# Patient Record
Sex: Female | Born: 1950 | Race: White | Hispanic: No | Smoking: Current every day smoker
Health system: Southern US, Community
[De-identification: ages and names within clinical notes are randomized; demographics above are authoritative.]

## PROBLEM LIST (undated history)

## (undated) DIAGNOSIS — Z9889 Other specified postprocedural states: Secondary | ICD-10-CM

## (undated) DIAGNOSIS — I10 Essential (primary) hypertension: Secondary | ICD-10-CM

## (undated) DIAGNOSIS — F419 Anxiety disorder, unspecified: Secondary | ICD-10-CM

## (undated) DIAGNOSIS — Z8719 Personal history of other diseases of the digestive system: Secondary | ICD-10-CM

## (undated) DIAGNOSIS — T8859XA Other complications of anesthesia, initial encounter: Secondary | ICD-10-CM

## (undated) DIAGNOSIS — J189 Pneumonia, unspecified organism: Secondary | ICD-10-CM

## (undated) DIAGNOSIS — B1920 Unspecified viral hepatitis C without hepatic coma: Secondary | ICD-10-CM

## (undated) DIAGNOSIS — M51369 Other intervertebral disc degeneration, lumbar region without mention of lumbar back pain or lower extremity pain: Secondary | ICD-10-CM

## (undated) DIAGNOSIS — K279 Peptic ulcer, site unspecified, unspecified as acute or chronic, without hemorrhage or perforation: Secondary | ICD-10-CM

## (undated) DIAGNOSIS — M5126 Other intervertebral disc displacement, lumbar region: Secondary | ICD-10-CM

## (undated) DIAGNOSIS — M1990 Unspecified osteoarthritis, unspecified site: Secondary | ICD-10-CM

## (undated) DIAGNOSIS — J40 Bronchitis, not specified as acute or chronic: Secondary | ICD-10-CM

## (undated) DIAGNOSIS — I499 Cardiac arrhythmia, unspecified: Secondary | ICD-10-CM

## (undated) DIAGNOSIS — Z87442 Personal history of urinary calculi: Secondary | ICD-10-CM

## (undated) DIAGNOSIS — F329 Major depressive disorder, single episode, unspecified: Secondary | ICD-10-CM

## (undated) DIAGNOSIS — I739 Peripheral vascular disease, unspecified: Secondary | ICD-10-CM

## (undated) DIAGNOSIS — I219 Acute myocardial infarction, unspecified: Secondary | ICD-10-CM

## (undated) DIAGNOSIS — R112 Nausea with vomiting, unspecified: Secondary | ICD-10-CM

## (undated) DIAGNOSIS — F32A Depression, unspecified: Secondary | ICD-10-CM

## (undated) DIAGNOSIS — G8929 Other chronic pain: Secondary | ICD-10-CM

## (undated) DIAGNOSIS — J45909 Unspecified asthma, uncomplicated: Secondary | ICD-10-CM

## (undated) DIAGNOSIS — G2581 Restless legs syndrome: Secondary | ICD-10-CM

## (undated) DIAGNOSIS — K219 Gastro-esophageal reflux disease without esophagitis: Secondary | ICD-10-CM

## (undated) DIAGNOSIS — J449 Chronic obstructive pulmonary disease, unspecified: Secondary | ICD-10-CM

## (undated) DIAGNOSIS — E538 Deficiency of other specified B group vitamins: Secondary | ICD-10-CM

## (undated) DIAGNOSIS — K5790 Diverticulosis of intestine, part unspecified, without perforation or abscess without bleeding: Secondary | ICD-10-CM

## (undated) DIAGNOSIS — Z8759 Personal history of other complications of pregnancy, childbirth and the puerperium: Secondary | ICD-10-CM

## (undated) DIAGNOSIS — T4145XA Adverse effect of unspecified anesthetic, initial encounter: Secondary | ICD-10-CM

## (undated) DIAGNOSIS — K7460 Unspecified cirrhosis of liver: Secondary | ICD-10-CM

## (undated) DIAGNOSIS — H9190 Unspecified hearing loss, unspecified ear: Secondary | ICD-10-CM

## (undated) DIAGNOSIS — M940 Chondrocostal junction syndrome [Tietze]: Secondary | ICD-10-CM

## (undated) DIAGNOSIS — M5136 Other intervertebral disc degeneration, lumbar region: Secondary | ICD-10-CM

## (undated) DIAGNOSIS — I2510 Atherosclerotic heart disease of native coronary artery without angina pectoris: Secondary | ICD-10-CM

## (undated) DIAGNOSIS — M549 Dorsalgia, unspecified: Secondary | ICD-10-CM

## (undated) HISTORY — PX: MULTIPLE TOOTH EXTRACTIONS: SHX2053

## (undated) HISTORY — PX: OTHER SURGICAL HISTORY: SHX169

## (undated) HISTORY — PX: COLONOSCOPY: SHX174

## (undated) HISTORY — PX: CORONARY ANGIOPLASTY: SHX604

## (undated) HISTORY — PX: TUBAL LIGATION: SHX77

## (undated) HISTORY — PX: APPENDECTOMY: SHX54

## (undated) HISTORY — PX: OVARIAN CYST REMOVAL: SHX89

---

## 1978-05-11 HISTORY — PX: CHOLECYSTECTOMY: SHX55

## 1980-05-11 HISTORY — PX: ECTOPIC PREGNANCY SURGERY: SHX613

## 1990-05-11 HISTORY — PX: CYSTOSCOPY W/ LITHOLAPAXY / EHL: SUR377

## 1993-05-11 HISTORY — PX: TUMOR EXCISION: SHX421

## 1993-05-11 HISTORY — PX: ABDOMINAL HYSTERECTOMY: SHX81

## 2003-02-18 ENCOUNTER — Encounter: Payer: Self-pay | Admitting: *Deleted

## 2003-02-18 ENCOUNTER — Observation Stay (HOSPITAL_COMMUNITY): Admission: AD | Admit: 2003-02-18 | Discharge: 2003-02-20 | Payer: Self-pay | Admitting: *Deleted

## 2003-02-20 ENCOUNTER — Encounter: Payer: Self-pay | Admitting: Hematology and Oncology

## 2003-02-26 ENCOUNTER — Other Ambulatory Visit (HOSPITAL_COMMUNITY): Admission: RE | Admit: 2003-02-26 | Discharge: 2003-05-27 | Payer: Self-pay | Admitting: Psychiatry

## 2003-05-12 DIAGNOSIS — I219 Acute myocardial infarction, unspecified: Secondary | ICD-10-CM

## 2003-05-12 HISTORY — DX: Acute myocardial infarction, unspecified: I21.9

## 2003-12-12 ENCOUNTER — Inpatient Hospital Stay (HOSPITAL_COMMUNITY): Admission: EM | Admit: 2003-12-12 | Discharge: 2003-12-17 | Payer: Self-pay | Admitting: Psychiatry

## 2004-05-11 HISTORY — PX: CARDIAC CATHETERIZATION: SHX172

## 2005-06-19 ENCOUNTER — Other Ambulatory Visit: Payer: Self-pay

## 2005-06-19 ENCOUNTER — Emergency Department: Payer: Self-pay | Admitting: Internal Medicine

## 2007-04-28 ENCOUNTER — Ambulatory Visit: Payer: Self-pay | Admitting: Gastroenterology

## 2007-05-18 ENCOUNTER — Ambulatory Visit (HOSPITAL_COMMUNITY): Admission: RE | Admit: 2007-05-18 | Discharge: 2007-05-18 | Payer: Self-pay | Admitting: Gastroenterology

## 2007-07-13 ENCOUNTER — Ambulatory Visit: Payer: Self-pay | Admitting: Obstetrics & Gynecology

## 2007-07-13 ENCOUNTER — Ambulatory Visit (HOSPITAL_COMMUNITY): Admission: RE | Admit: 2007-07-13 | Discharge: 2007-07-13 | Payer: Self-pay | Admitting: Obstetrics and Gynecology

## 2007-07-13 ENCOUNTER — Encounter: Payer: Self-pay | Admitting: Obstetrics & Gynecology

## 2008-09-17 ENCOUNTER — Ambulatory Visit: Payer: Self-pay | Admitting: Physician Assistant

## 2009-02-27 ENCOUNTER — Ambulatory Visit: Payer: Self-pay | Admitting: Internal Medicine

## 2009-05-24 ENCOUNTER — Ambulatory Visit: Payer: Self-pay | Admitting: Obstetrics & Gynecology

## 2009-05-25 ENCOUNTER — Encounter: Payer: Self-pay | Admitting: Obstetrics & Gynecology

## 2009-05-25 LAB — CONVERTED CEMR LAB
Clue Cells Wet Prep HPF POC: NONE SEEN
Trich, Wet Prep: NONE SEEN
Yeast Wet Prep HPF POC: NONE SEEN

## 2010-03-06 ENCOUNTER — Ambulatory Visit: Payer: Self-pay | Admitting: Internal Medicine

## 2010-07-10 ENCOUNTER — Emergency Department (HOSPITAL_COMMUNITY): Payer: Medicare Other

## 2010-07-10 ENCOUNTER — Inpatient Hospital Stay (HOSPITAL_COMMUNITY)
Admission: EM | Admit: 2010-07-10 | Discharge: 2010-07-12 | DRG: 313 | Disposition: A | Discharge status: Home / Self Care | Payer: Medicare Other | Attending: Internal Medicine | Admitting: Internal Medicine

## 2010-07-10 DIAGNOSIS — I10 Essential (primary) hypertension: Secondary | ICD-10-CM | POA: Diagnosis present

## 2010-07-10 DIAGNOSIS — I2510 Atherosclerotic heart disease of native coronary artery without angina pectoris: Secondary | ICD-10-CM | POA: Diagnosis present

## 2010-07-10 DIAGNOSIS — F411 Generalized anxiety disorder: Secondary | ICD-10-CM | POA: Diagnosis present

## 2010-07-10 DIAGNOSIS — R0789 Other chest pain: Principal | ICD-10-CM | POA: Diagnosis present

## 2010-07-10 DIAGNOSIS — K219 Gastro-esophageal reflux disease without esophagitis: Secondary | ICD-10-CM | POA: Diagnosis present

## 2010-07-10 DIAGNOSIS — R51 Headache: Secondary | ICD-10-CM | POA: Diagnosis present

## 2010-07-10 DIAGNOSIS — I252 Old myocardial infarction: Secondary | ICD-10-CM

## 2010-07-10 DIAGNOSIS — E669 Obesity, unspecified: Secondary | ICD-10-CM | POA: Diagnosis present

## 2010-07-10 LAB — COMPREHENSIVE METABOLIC PANEL
ALT: 14 U/L (ref 0–35)
AST: 15 U/L (ref 0–37)
CO2: 27 mEq/L (ref 19–32)
Calcium: 8.9 mg/dL (ref 8.4–10.5)
GFR calc Af Amer: >60 mL/min (ref >60)
GFR calc non Af Amer: 55 mL/min — ABNORMAL LOW (ref >60)
Potassium: 3.7 mEq/L (ref 3.5–5.1)
Sodium: 142 mEq/L (ref 135–145)

## 2010-07-10 LAB — CBC
HCT: 45.4 % (ref 36.0–46.0)
Hemoglobin: 15.2 g/dL — ABNORMAL HIGH (ref 12.0–15.0)
MCH: 32.1 pg (ref 26.0–34.0)
MCHC: 33.5 g/dL (ref 30.0–36.0)
MCV: 96 fL (ref 78.0–100.0)
Platelets: 312 10*3/uL (ref 150–400)
RBC: 4.73 MIL/uL (ref 3.87–5.11)
RDW: 14.1 % (ref 11.5–15.5)
WBC: 12.1 10*3/uL — ABNORMAL HIGH (ref 4.0–10.5)

## 2010-07-10 LAB — POCT CARDIAC MARKERS
CKMB, poc: <1 ng/mL — ABNORMAL LOW (ref 1.0–8.0)
Troponin i, poc: <0.05 ng/mL (ref 0.00–0.09)

## 2010-07-11 DIAGNOSIS — R072 Precordial pain: Secondary | ICD-10-CM

## 2010-07-11 LAB — RAPID URINE DRUG SCREEN, HOSP PERFORMED
Barbiturates: NOT DETECTED
Benzodiazepines: NOT DETECTED
Cocaine: NOT DETECTED

## 2010-07-11 LAB — CARDIAC PANEL(CRET KIN+CKTOT+MB+TROPI)
CK, MB: 0.9 ng/mL (ref 0.3–4.0)
Relative Index: INVALID (ref 0.0–2.5)
Total CK: 28 U/L (ref 7–177)
Total CK: 31 U/L (ref 7–177)
Troponin I: 0.01 ng/mL (ref 0.00–0.06)

## 2010-07-11 LAB — COMPREHENSIVE METABOLIC PANEL
AST: 20 U/L (ref 0–37)
Albumin: 2.9 g/dL — ABNORMAL LOW (ref 3.5–5.2)
CO2: 28 mEq/L (ref 19–32)
Calcium: 8.2 mg/dL — ABNORMAL LOW (ref 8.4–10.5)
Creatinine, Ser: 0.8 mg/dL (ref 0.4–1.2)
GFR calc Af Amer: >60 mL/min (ref >60)
GFR calc non Af Amer: >60 mL/min (ref >60)

## 2010-07-11 LAB — CK TOTAL AND CKMB (NOT AT ARMC)
CK, MB: 0.8 ng/mL (ref 0.3–4.0)
Relative Index: INVALID (ref 0.0–2.5)
Total CK: 36 U/L (ref 7–177)

## 2010-07-11 LAB — URINALYSIS, ROUTINE W REFLEX MICROSCOPIC
Urine Glucose, Fasting: NEGATIVE mg/dL
pH: 5.5 (ref 5.0–8.0)

## 2010-07-11 LAB — CBC
MCH: 32 pg (ref 26.0–34.0)
MCHC: 32.5 g/dL (ref 30.0–36.0)
Platelets: 283 10*3/uL (ref 150–400)

## 2010-07-11 LAB — LIPASE, BLOOD: Lipase: 35 U/L (ref 11–59)

## 2010-07-11 LAB — POCT CARDIAC MARKERS

## 2010-07-11 LAB — LIPID PANEL
Cholesterol: 160 mg/dL (ref 0–200)
Total CHOL/HDL Ratio: 4.6 RATIO

## 2010-07-12 LAB — CBC
Hemoglobin: 13 g/dL (ref 12.0–15.0)
MCHC: 31.8 g/dL (ref 30.0–36.0)
Platelets: 274 10*3/uL (ref 150–400)
RDW: 14.4 % (ref 11.5–15.5)

## 2010-07-12 LAB — BASIC METABOLIC PANEL
BUN: 8 mg/dL (ref 6–23)
Calcium: 8.3 mg/dL — ABNORMAL LOW (ref 8.4–10.5)
Creatinine, Ser: 0.87 mg/dL (ref 0.4–1.2)
GFR calc non Af Amer: >60 mL/min (ref >60)
Glucose, Bld: 113 mg/dL — ABNORMAL HIGH (ref 70–99)
Sodium: 140 mEq/L (ref 135–145)

## 2010-07-27 LAB — GLUCOSE, CAPILLARY: Glucose-Capillary: 108 mg/dL — ABNORMAL HIGH (ref 70–99)

## 2010-07-27 NOTE — Discharge Summary (Signed)
Kayla Stout, KINDEL NO.:  [id number]  MEDICAL RECORD NO.:  [id number]           PATIENT TYPE:  LOCATION:                                 FACILITY:  PHYSICIAN:  Rosanna Randy, MDDATE OF BIRTH:  [birth year]  DATE OF ADMISSION:07/10/10 DATE OF DISCHARGE:07/12/10                              DISCHARGE SUMMARY   PRIMARY CARE PHYSICIAN:  Dr. Bethann Punches at [facility] in [place].  DISCHARGE DIAGNOSES: 1. Chest pain, most likely noncardiac with negative cardiac enzymes,     no EKG changes significant for ischemia and a 2-D echo with a     systolic ejection fraction of 81%. 2. History of myocardial infarction 6 years ago, after which the     patient had a cardiac cath at [facility] and was told that she     was unable to be stented due to calcification on the coronary     arteries that were occluded. 3. Hypertension. 4. Obesity. 5. Anxiety. 6. Gastroesophageal reflux disease. 7. Headache secondary to sinusitis. 8. Elevated intraocular pressure. 9. History of hepatitis C. 10.The patient also has a past surgical history of cholecystectomy,     laparoscopy, and laparoscopic surgery for ectopic pregnancy.  MEDICATIONS AT DISCHARGE: 1. Loratadine 10 mg one tablet by mouth daily. 2. Nexium 40 mg one tablet by mouth twice a day. 3. Aspirin 81 mg one tablet by mouth daily. 4. Lisinopril 10 mg one tablet by mouth daily. 5. Toprol-XL 50 mg one tablet by mouth daily. 6. Travoprost ophthalmic 0.004% two drops in both eyes every other     day. 7. Alprazolam 1 mg one tablet by mouth daily at bedtime as needed for     anxiety and difficulty falling asleep.  DISPOSITION AND FOLLOWUP:  The patient has been discharged in stable improved condition without complaints of any chest pain.  In fact, the patient has been chest pain free for the last 24 hours after being admitted.  She has had cardiac markers cycled throughout this admission which were completely  negative and also had serial EKGs that demonstrated no ischemic changes.  There was no abnormal activity on her telemetry traces, and a 2-D echo with a normal systolic ejection fraction but unable to assess wall motion abnormalities secondary to the patient's body habitus.  The patient had been advised to arrange a hospital followup visit with her primary care physician and is going to also call Dr. Annitta Jersey office at [phone] in order to arrange an outpatient stress test with him due to her risk factors and history of prior myocardial infarction.  PROCEDURES PERFORMED:  The patient had a chest x-ray on July 10, 2010, which demonstrated mild peribronchial thickening noted but her lungs were otherwise clear.  The patient also had a 2-D echo done on July 11, 2010, that demonstrated left ventricle with normal size, normal wall thickness, normal systolic function with an ejection fraction in the range of 60-65%.  Unfortunately, images were inadequate for left ventricular wall motion assessment due to the patient's body habitus. Doppler parameters were consistent with abnormal left ventricular relaxation categorizing the patient with grade 1  diastolic dysfunction. No other procedures were made done during this admission.  No consultations were made during this admission.  BRIEF HISTORY OF HISTORY OF PRESENT ILLNESS VITAL SIGNS AND LABS:  For further details regarding this info, refer to H and P dictated by Dr. Midge Minium on June 12, 2010, but in detail, Kayla Stout is a 60 year old female with a history of previous myocardial infarction 6 years ago, history of hypertension, history of gastroesophageal reflux disease, history of obesity, and history of anxiety who came to the emergency department complaining of chest pain.  The patient reports that her chest pain started at around 6:00 p.m. in the afternoon, was retrosternal, pressure like, and also stabbing in nature.  The pain  was nonradiating.  Has no associated shortness of breath or any diaphoresis. She denies any dizziness or loss of consciousness while having this pain.  The patient in the ER had an EKG which failed to show any acute ischemic changes.  Cardiac enzymes have been negative.  The patient became hypotensive after she received some morphine in order to control her pain.  Presently, she had been with a normal blood pressure after fluid resuscitation and holding her blood pressure medications overnight.  At the moment of admission, the patient denies any nausea, vomiting, abdominal pain, dysuria, discharge, or diarrhea.  There were no exertional symptoms associated with her pain, and she denies any fever, chills, cough, or phlegm.  PHYSICAL EXAMINATION:  VITAL SIGNS:  Blood pressure 112/68 after fluid resuscitation, heart rate 77, temperature 97.7, respiratory rate 18, and oxygen saturation of 97% on room air. HEENT:  Anicteric, no pallor.  No discharge from ears, eyes, nose, and mouth. CHEST:  Bilateral air entry present.  No rhonchi, no crepitation, no wheezes. HEART:  S1 and S2.  No murmurs. ABDOMEN:  Soft, nontender, positive bowel sounds. EXTREMITIES:  Peripheral pulses were appreciated bilaterally.  No edema. NEUROLOGIC:  The patient was alert, awake, and oriented x3, able to move four extremities without any problems.  She was able to follow commands. Cranial nerves were II through XII intact and muscle strength was 5/5 bilaterally symmetrically.  LABORATORY DATA ON ADMISSION:  She had a CBC demonstrating white blood cells of 12.1, hemoglobin 15.2, platelets 312.  Cardiac markers negative x3 throughout this hospitalization.  She had a negative D-dimer. Urinary drug screen was only positive for opiates but it was taken after the patient received morphine in the emergency department.  Urinalysis was negative.  She had a lipase demonstrating a range of 35.  Magnesium level was 2.1, TSH  0.849.  Lipid profile taking throughout this hospitalization demonstrated a total cholesterol of 160, triglyceride 156, HDL 35, LDL 94.  HOSPITAL COURSE BY PROBLEM: 1. Chest pain, most likely noncardiac in etiology after having three     sets of cardiac enzymes negative, having normal traces on her     telemetry, and also no EKG changes.  She also had a 2-D echo with     normal systolic ejection fraction.  Nonetheless, due to the     patient's risk factors and also the prior history of myocardial     infarction after discussing this case with Dr. Sharyn Lull, Cardiology,     the decision was taken to have the patient see him as an outpatient     in order to have a stress test done.  The patient received Dr.     Annitta Jersey office information and also information was given to Dr.  Harwani in order to have this stress test set up.  The patient was     discharged with a double dose of her usual PPI medication in order     to treat this chest pain as a gastroesophageal reflux disease while     having a stress test as an outpatient to rule out any abnormality     with her heart. 2. Gastroesophageal reflux disease.  The patient is going to continue     using Nexium, but at this moment, she is going to start using 40 mg     by mouth twice a day. 3. Hypertension.  Blood pressure had been well controlled after her     fluid resuscitation, and the plan is to continue her lisinopril 10     mg by mouth daily and also planning to continue her Toprol-XL 50 mg     by mouth daily. 4. Anxiety.  Plan is to continue using p.r.n. Xanax. 5. History of elevated intraocular pressure.  The patient is going to     continue using Travatan ophthalmic solution. 6. History of obesity.  The patient had received advices of weight     loss and to reduce also her calorie intake in order to lose some     weight. 7. Headache secondary to sinusitis.  The patient had been started on     loratadine in order to control this  problem.  At discharge, the patient's vital signs, temperature 98.3, heart rate 77, respiratory rate 18, blood pressure 120/74, oxygen saturation 94% on room air.  She had a BMET demonstrating a sodium of 140, potassium 3.8, chloride 107, bicarb 27, BUN 8, creatinine 0.87, glucose 113.  She also had a CBC that demonstrated a white blood cells of 7.9, hemoglobin 13.0, and platelet of 274.     Rosanna Randy, MD     CEM/MEDQ  D:  07/12/2010  T:  07/13/2010  Job:  [id number]  cc:   Crista Luria Sharyn Lull, M.D.  Electronically Signed by Vassie Loll MD on 07/21/2010 09:27:20 AM Electronically Signed by Vassie Loll MD on 07/27/2010 10:15:03 PM

## 2010-08-11 NOTE — H&P (Signed)
NAMEGIADA, Kayla Stout               ACCOUNT NO.:  [id number]  MEDICAL RECORD NO.:  [id number]           PATIENT TYPE:  LOCATION:                                 FACILITY:  PHYSICIAN:  Eduard Clos, MDDATE OF BIRTH:  03/08/1951  DATE OF ADMISSION: DATE OF DISCHARGE:                             HISTORY & PHYSICAL   PRIMARY CARE PHYSICIAN:Clinic at [facility].  CHIEF COMPLAINT:  Chest pain.  HISTORY OF PRESENT ILLNESS:  This is a 60 year old female with history of previous hemorrhoid 6 years ago, history of hypertension, anxiety, obesity, has come to the ER because of chest pain.  Basically, this chest pain started over lasting around 6:00 p.m., retrosternal with pressure-like and also stabbing in nature, nonradiating.  Has no associated shortness of breath or any diaphoresis.  Denies any dizziness or loss of consciousness.  The patient in the ER had an EKG which was not showing any acute features.  Cardiac enzymes have been negative. The patient was hypotensive after the patient got some morphine. Presently, the patient has been admitted for further observation.  The patient denies any nausea, vomiting, abdominal pain, dysuria, discharge, diarrhea.  Denies any exertional symptoms.  Denies any fever, chills, cough, phlegm.  PAST MEDICAL HISTORY: 1. History of MI 6 years ago after which the patient had a cardiac     cath at [facility] and the patient has been told out she was not able to     be stented. 2. History of for hypertension. 3. Obesity. 4. Anxiety. 5. History hepatitis C. 6. Previous history of drug abuse including cocaine as per the E-     chart.  PAST SURGICAL HISTORY:  Cholecystectomy, laparoscopic surgery for ectopic pregnancy.  ALLERGIES:  PENICILLIN AND CODEINE.  MEDICATIONS PRIOR TO ADMISSION: 1. Travatan ophthalmic solution. 2. Nexium. 3. Xanax 1 tablet at bedtime as needed p.r.n. 4. Toprol-XL 50 mg p.o. daily. 5. Lisinopril 10 mg daily. 6. Aspirin  81 mg p.o. two tablets daily.  FAMILY HISTORY:  Significant for MI and hypertension.  SOCIAL HISTORY:  The patient smokes cigarettes.  Denies any alcohol or drug abuse.  Per her old chart, it showed that the patient does have abused cocaine.  REVIEW OF SYSTEMS:  As per the history of presenting illness, nothing else significant.  PHYSICAL EXAMINATION:  GENERAL:  The patient examined at the bedside, not in acute distress. VITAL SIGNS:  Blood pressure is presently 112/68 and it has gone down up to 93 systolic, pulse is 77 per minute, temperature 97.7, respiration 18 per minute, and O2 sat 97%. HEENT:  Anicteric.  No pallor.  No discharge from ears, eyes, nose or mouth. CHEST:  Bilateral air entry present.  No rhonchi.  No crepitation. HEART:  S1 and S2 heard. ABDOMEN:  Soft, nontender.  Bowel sounds heard. CNS:  The patient is alert, awake; oriented to time, place, and person. Moves upper and lower extremities 5/5. EXTREMITIES:  Peripheral pulses are felt.  No edema.  LABORATORY DATA:  EKG shows normal sinus rhythm with distant sinus brady, but the monitor is showing heart rate of 70 beats per minute, poor R-wave  progression and nonspecific ST-T changes.  Chest x-ray shows mild peribronchial thickening noted.  Lung otherwise clear.  CBC:  WBC is 12.1, hemoglobin is 15.3, hematocrit is 45.4, platelets 312,000.  D- dimer of 0.35.  Complete metabolic panel:  Sodium 142, potassium 3.7, chloride 105, carbon dioxide 27, glucose 93, BUN 8, creatinine 1, total bilirubin is 0.6, alk phos 98, AST 15, ALT 14, total protein 7, albumin 3.3, calcium 8.9, CK-MB less than 1.0.  Troponin less than 0.05 x2. Urine drug screen positive.  The patient did receive half morphine before urine drug screen.  ASSESSMENT: 1. Chest pain, rule out acute coronary syndrome. 2. Hypotension, probably medication related. 3. History of myocardial infarction. 4. History of anxiety. 5. History of hypertension. 6.  History of obesity. 7. Ongoing tobacco abuse.  PLAN: 1. At this time, admit the patient to telemetry. 2. For chest pain, we will cycle cardiac markers.  The patient will be     placed on aspirin.  Get 2-D echo.  At this time, we will avoid     nitroglycerin due to hypotension. 3. Hypotension probably from medication.  At this time, we are going     to hold her lisinopril and Toprol-XL, which can be restarted once     her blood pressure is corrected.  The patient is receiving fluids     at this time. 4. We will also check the lipase. 5. Further recommendation based on test orders.     Eduard Clos, MD     ANK/MEDQ  D:  07/11/2010  T:  07/11/2010  Job:  [id number]  Electronically Signed by Midge Minium MD on 08/11/2010 07:21:35 AM

## 2010-09-23 NOTE — Discharge Summary (Signed)
Kayla Stout, Kayla Stout               ACCOUNT NO.:  [id number]   MEDICAL RECORD NO.:  [id number]          PATIENT TYPE:  OUT   LOCATION:  [facility]                          FACILITY:  [facility]   PHYSICIAN:  Ruthe Mannan, M.D.       DATE OF BIRTH:  1951-01-26   DATE OF ADMISSION:  07/13/2007  DATE OF DISCHARGE:                               DISCHARGE SUMMARY   CHIEF COMPLAINT:  The patient is here for yearly examination.   HISTORY OF PRESENT ILLNESS:  This is a gravida 5, para 3, white female  who presents for her yearly examination.  Her last Pap smear was  approximately five years ago.  She has not had insurance so she has not  been back.  She has had no history of abnormal Pap smears.  She just had  her mammogram earlier today.  She has had no problems with vaginal  discharge.  She is sexually active with her husband and does not use  contraception as she has been menopausal since 2001.  She also had a  bilateral tubal ligation after her last pregnancy.   PAST MEDICAL HISTORY:  She has a history of diverticulosis.  She also  had an ectopic pregnancy in 1984.   PAST OBSTETRICAL HISTORY:  She is a gravida 4.  She has three living  children.  She had one miscarriage which was twins and also has had one  ectopic pregnancy.   CURRENT MEDICATIONS:  1. Toprol.  2. Aspirin.  3. Lisinopril.  4. Zocor.  5. Protonix.   ALLERGIES:  PENICILLIN, CODEINE.   FAMILY HISTORY:  She has no family history of cervical, ovarian, or  breast cancer.  She does have a history of coronary artery disease in  her grandmother and high blood pressure in her grandmother as well.   PHYSICAL EXAMINATION:  VITAL SIGNS:  Temperature 97.4, pulse 88, blood  pressure 113/59, weight 264.5.  GENERAL:  She is alert, pleasant, obese female.  ABDOMEN:  Positive bowel sounds, nontender on palpation.  PELVIC:  Normal introitus.  Some minor vaginal atrophy.  No cervical  motion tenderness.   ASSESSMENT:  This is a 60 year old  gravida 5, para 3, white female here  for her yearly examination with no complaints.  Pap is performed today.  We will follow up with the results.  If normal, I would not repeat Pap  for a few years.      Ruthe Mannan, M.D.  Electronically Signed     TA/MEDQ  D:  07/13/2007  T:  07/13/2007  Job:  [id number]

## 2010-09-26 NOTE — Discharge Summary (Signed)
Kayla Stout, Kayla Stout                           ACCOUNT NO.:  [id number]   MEDICAL RECORD NO.:  [id number]                   PATIENT TYPE:  IPS   LOCATION:  0504                                 FACILITY:  [facility]   PHYSICIAN:  Geoffery Lyons, M.D.                   DATE OF BIRTH:  May 13, 1950   DATE OF ADMISSION:  12/12/2003  DATE OF DISCHARGE:  12/17/2003                                 DISCHARGE SUMMARY   CHIEF COMPLAINT AND PRESENTING ILLNESS:  This was the first admission to  [name]  for this 60 year old married white female,  voluntarily admitted.  She apparently had some mood lability per her  therapist, had taken cocaine to stop her racing thoughts.  Felt desperate  and out of control.  Three hours of sleep in 3 days, 2 weeks of thoughts  racing and disorganized.  Denies hallucinations.  Appetite was erratic.  Suicidal thoughts, elevated mood x9 months, depressed recently, used cocaine  once as she claimed.   PAST PSYCHIATRIC HISTORY:  Has seen Dr. Ladona Stout on an outpatient basis and  Kayla Stout, first time Behavioral Health.  Nine months bipolar, seen at  [facility].   ALCOHOL AND DRUG HISTORY:  Occasional use of cocaine.   PAST MEDICAL HISTORY:  Hepatitis C, diverticulitis.   MEDICATIONS:  Protonix 40 mg twice a day, Pegasys 200 mg daily, Interferon  100 mg every week.   PHYSICAL EXAMINATION:  Performed, failed to show any acute findings.   LABORATORY WORKUP:  CBC within normal limits.  Blood chemistries:  Potassium  3.1, replaced to 3.4.  Hemoglobin 11.0, MCV 104, SGOT 16, SGPT 10.  TSH  .476.  B12 273, folate 448.   MENTAL STATUS EXAM:  Upon admission revealed a fully alert, pleasant,  cooperative female, somewhat anxious, restless during the evaluation,  disheveled, hypervigilant.  Speech rapid, pressured, mood anxious but  depressed, affect anxious, thought processes distractibility,  circumstantial, at times tangential, suicidal  ideations, thoughts of  overdosing.  No overt delusions.  Cognition well preserved.   ADMISSION DIAGNOSES:   AXIS I:  1.  Bipolar disorder, mixed.  2.  Cocaine abuse.   AXIS II:  No diagnosis.   AXIS III:  Hepatitis C, hypokalemia, corrected, diverticulitis.   AXIS IV:  Moderate.   AXIS V:  Global assessment of function upon admission 24, highest global  assessment of function in past year 65-68.   COURSE IN HOSPITAL:  She  was admitted and started on intensive individual  and group psychotherapy.  She was given Ambien for sleep.  She was given  Ativan as needed for anxiety, Protonix 40 mg 2 daily, ribavirin, Interferon,  Pegasys 180 mcg subcutaneous every week.  Copegus 200 mg 7 tablets in the  afternoon at  7 p.m. after supper.  We gave her Seroquel 50 at night.  She stated that she  felt racing thoughts, feeling jittery, claimed that her mind was just filled  with thoughts, almost in waves and could not stop them.  Off medications for  9 months when she was treated for hepatitis C.  The patient was afraid of  being on too many medications.  Was doing good without them but slowly  started going out of control.  She claimed used cocaine to calm herself  down.  Did  evidence pressured speech.  Hyperthymic.  Felt depressed,  secondary to the medications she was taking as a known side effect.  We went  ahead and considered options.  She was started on lithium 300 mg twice a  day.  She tolerated the lithium quite well, felt more stable.  Speech was  still pressured, but continued to improve.  There was a family session with  her and her husband.  She denied suicidal or homicidal ideations.  She was  willing and motivated to pursue further outpatient treatment.  August 8 she  was in full contact with reality.  There were no suicidal ideas, no  homicidal ideas, no hallucinations, no delusions.  Her mood was more evened  out.  She was willing and motivated to pursue outpatient  treatment and  continue to take her medications, therefore she was discharged to outpatient  follow-up.   DISCHARGE DIAGNOSES:   AXIS I:  1.  Bipolar disorder, mixed.  2.  Cocaine abuse.   AXIS II:  No diagnosis.   AXIS III:  Hepatitis C, hypokalemia, corrected, diverticulitis.   AXIS IV:  Moderate.   AXIS V:  Global assessment of function upon discharge 55-60.   DISCHARGE MEDICATIONS:  1.  Seroquel 50 at night.  2.  Lithium carbonate 300 one in the morning and 2 at night.  3.  K-Dur 20 mEq twice a day for 2 more days.  4.  Trazodone 50 at night for sleep.  5.  Resume home medications, Copegus, Pegasys, and _________.  6.  Ativan 1 mg up to 4 times as needed for anxiety.   DISPOSITION:  Follow up with Dr. Evelene Croon and Kayla Stout.                                               Geoffery Lyons, M.D.    IL/MEDQ  D:  01/09/2004  T:  01/10/2004  Job:  [id number]

## 2010-09-26 NOTE — H&P (Signed)
Kayla, Stout                           ACCOUNT NO.:  [id number]   MEDICAL RECORD NO.:  [id number]                   PATIENT TYPE:  INP   LOCATION:  1823                                 FACILITY:  [facility]   PHYSICIAN:  Alfonse Spruce, M.D.               DATE OF BIRTH:  10/07/1960   DATE OF ADMISSION:  02/18/2003  DATE OF DISCHARGE:                                HISTORY & PHYSICAL   PRIMARY CARE PHYSICIAN:  Unknown.   HISTORY AND CHIEF COMPLAINT:  The patient brought to the ED by the ambulance  in semiconscious status after being found by the police at her home, on the  floor, with the possibility of OD on Ativan.   HISTORY OF PRESENT ILLNESS:  [name] was found by the police on  the floor with the bottle of Ativan prescribed to her on October 6th, an  empty bottle, that used to be 30 pills prescribed on the 6th.  The amount  taken is unclear.  No history can be obtained from the patient as she is  lethargic, only responding to loud verbal stimuli; however, she had  spontaneous breathing.  I was called from the emergency room to see the  patient and to admit the patient as the patient overdosed on Ativan.  However, the husband is Spanish speaking, and could not communicate despite  that we obtained an interpreter, a nurse, still could not give [place] any valid  history.  The collection of information obtained from the husband with the  interpreter indicated that he was at home; he went to bed at 10 p.m. she was  not home; he woke up at the knock of the police, opened the door, and found  his wife on the floor with the bottle of Ativan at her side. He was not  clear who called the ambulance and if he heard any problem with the patient  or the patient calling him or even falling on the floor -- he could not give  any history and he did not try to communicate in English either.   In the emergency room the patient had vomitus, greenish-bile-like, three and  did not have  any other treatment.  She was on room air with pulse oximetry  97. They did a drug screen and found that opiate was not detected; the  cocaine was positive; benzodiazepine was not detected and barbiturates not  detected; amphetamines were not detected; tetrahydrocannabinoid was not  detected; only positive for cocaine.  Her electrolytes indicated: Sodium  139, potassium 3.3, chloride 104, BUN 5, blood sugar 96, and her pH was 7.4  with pCO2 of 45. Her white count was 12.8; hemoglobin was 14.6 with  hematocrit 42.8.  Platelet count 355 and the acetaminophen level less than  10 and salicylate level less than 4.   There is no available old record and no available history from  the husband  and no relative.  The husband even could not tell me how many members of the  family or what is the previous illness that his wife had in the past.  Even  how many children they have.  She arrived by the ambulance, [facility]  EMS, on a stretcher. The ER physician was Dr. Sheppard Penton. Kayla Stout.  No further  history could be obtained from the patient as the patient is lethargic and  semiconscious.  No previous record in the hospital and requested records  from [facility].   REVIEW OF SYSTEMS:  The physical reviewing of the systems could not be  obtained.   PHYSICAL EXAMINATION:  GENERAL: The patient is very lethargic,  semiconscious, very poorly responding to verbal stimuli, spontaneous  breathing.  VITAL SIGNS:  The blood pressure on admission was 111/78, 119/66 with  respiratory rate between 26 to 22.  Pulse rate 88 to 93. Her pulse oximetry  was 93 and 97 subsequently.  HEENT:  Her pupils are reactive.  The oropharynx could not be examined; the  patient clenching her teeth.  NECK:  Her neck was supple.  No JVD, no thyromegaly, no lymphadenopathy,  __________ midline.  CHEST:  She had bilateral rhonchi, no wheezes; however, no dullness on  percussion.  Questionable aspiration with the  vomiting, considered.  HEART:  PMI in the fifth intercostal space, normal S1, S2, no S3, no S4, no  gallop.  ABDOMEN:  The abdomen was soft, obese, positive bowel sounds.  She has a  scar in the right upper quadrant from previous cholecystectomy most likely.  Positive bowel sounds.  No organ enlargement.  No epigastric tenderness.  EXTREMITIES:  Positive pulses bilateral and symmetrical.  No edema.  NEUROLOGIC:  The deep tendon reflexes were 2+ bilateral and symmetrical/2+  in the lower extremities and upper extremities.  Cranial nerves could not be  tested and plantar reflex was normal.  No history of hypertension can be  obtained at this time and no history of seizure disorder can be obtained at  this time.   ASSESSMENT:  1. Drug overdose etiology is unclear with discrepancy between a bottle of     Ativan and negative, not detected, in the blood testing with the positive     cocaine in the blood testing.  2. Underlying vomiting x3 in the emergency room with the possibility of     aspiration considered.  3. Spontaneous breathing at this time with no evidence of respiratory     failure.   PLAN:  The patient will be admitted to the ICU and IV fluids, Narcan 0.5 mg  will be given IV.  Handheld nebulizers will be given and IV antibiotic with  the questionable underlying aspiration. Obtaining records from [facility] and the patient will be subsequently followed by Dr. Hannah Beat.  Further clarification of the history hopefully will be obtained when the  patient starts to be more conscious.  The neuro checks will also be  continued to be monitored as well as the respiratory pattern. Future  intubation will be considered if there are signs of respiratory failure.                                                Alfonse Spruce, M.D.    Kayla Stout  D:  02/18/2003  T:  02/18/2003  Job:  [id number]

## 2011-03-31 ENCOUNTER — Emergency Department (HOSPITAL_COMMUNITY): Payer: Medicare Other

## 2011-03-31 ENCOUNTER — Encounter: Payer: Self-pay | Admitting: Emergency Medicine

## 2011-03-31 ENCOUNTER — Inpatient Hospital Stay (HOSPITAL_COMMUNITY)
Admission: EM | Admit: 2011-03-31 | Discharge: 2011-04-07 | DRG: 189 | Disposition: A | Discharge status: Home / Self Care | Payer: Medicare Other | Attending: Internal Medicine | Admitting: Internal Medicine

## 2011-03-31 DIAGNOSIS — E669 Obesity, unspecified: Secondary | ICD-10-CM

## 2011-03-31 DIAGNOSIS — I252 Old myocardial infarction: Secondary | ICD-10-CM

## 2011-03-31 DIAGNOSIS — J441 Chronic obstructive pulmonary disease with (acute) exacerbation: Secondary | ICD-10-CM | POA: Diagnosis present

## 2011-03-31 DIAGNOSIS — E871 Hypo-osmolality and hyponatremia: Secondary | ICD-10-CM | POA: Diagnosis present

## 2011-03-31 DIAGNOSIS — F17200 Nicotine dependence, unspecified, uncomplicated: Secondary | ICD-10-CM | POA: Diagnosis present

## 2011-03-31 DIAGNOSIS — F1411 Cocaine abuse, in remission: Secondary | ICD-10-CM | POA: Insufficient documentation

## 2011-03-31 DIAGNOSIS — B1920 Unspecified viral hepatitis C without hepatic coma: Secondary | ICD-10-CM | POA: Diagnosis present

## 2011-03-31 DIAGNOSIS — I219 Acute myocardial infarction, unspecified: Secondary | ICD-10-CM

## 2011-03-31 DIAGNOSIS — R7309 Other abnormal glucose: Secondary | ICD-10-CM | POA: Diagnosis present

## 2011-03-31 DIAGNOSIS — I509 Heart failure, unspecified: Secondary | ICD-10-CM | POA: Diagnosis present

## 2011-03-31 DIAGNOSIS — F411 Generalized anxiety disorder: Secondary | ICD-10-CM | POA: Diagnosis present

## 2011-03-31 DIAGNOSIS — J189 Pneumonia, unspecified organism: Secondary | ICD-10-CM

## 2011-03-31 DIAGNOSIS — I5021 Acute systolic (congestive) heart failure: Secondary | ICD-10-CM | POA: Diagnosis not present

## 2011-03-31 DIAGNOSIS — J9600 Acute respiratory failure, unspecified whether with hypoxia or hypercapnia: Principal | ICD-10-CM | POA: Diagnosis present

## 2011-03-31 DIAGNOSIS — F419 Anxiety disorder, unspecified: Secondary | ICD-10-CM

## 2011-03-31 DIAGNOSIS — R0902 Hypoxemia: Secondary | ICD-10-CM

## 2011-03-31 DIAGNOSIS — J209 Acute bronchitis, unspecified: Secondary | ICD-10-CM

## 2011-03-31 DIAGNOSIS — I2510 Atherosclerotic heart disease of native coronary artery without angina pectoris: Secondary | ICD-10-CM | POA: Diagnosis present

## 2011-03-31 DIAGNOSIS — D72829 Elevated white blood cell count, unspecified: Secondary | ICD-10-CM | POA: Diagnosis present

## 2011-03-31 HISTORY — DX: Chondrocostal junction syndrome (tietze): M94.0

## 2011-03-31 HISTORY — DX: Pneumonia, unspecified organism: J18.9

## 2011-03-31 HISTORY — DX: Acute myocardial infarction, unspecified: I21.9

## 2011-03-31 HISTORY — DX: Unspecified osteoarthritis, unspecified site: M19.90

## 2011-03-31 HISTORY — DX: Personal history of other complications of pregnancy, childbirth and the puerperium: Z87.59

## 2011-03-31 HISTORY — DX: Dorsalgia, unspecified: M54.9

## 2011-03-31 HISTORY — DX: Other chronic pain: G89.29

## 2011-03-31 HISTORY — DX: Personal history of urinary calculi: Z87.442

## 2011-03-31 HISTORY — DX: Essential (primary) hypertension: I10

## 2011-03-31 HISTORY — DX: Personal history of other diseases of the digestive system: Z87.19

## 2011-03-31 HISTORY — DX: Unspecified viral hepatitis C without hepatic coma: B19.20

## 2011-03-31 HISTORY — DX: Anxiety disorder, unspecified: F41.9

## 2011-03-31 LAB — DIFFERENTIAL
Basophils Absolute: 0 10*3/uL (ref 0.0–0.1)
Basophils Absolute: 0 10*3/uL (ref 0.0–0.1)
Eosinophils Absolute: 0 10*3/uL (ref 0.0–0.7)
Eosinophils Absolute: 0 10*3/uL (ref 0.0–0.7)
Eosinophils Relative: 0 % (ref 0–5)
Eosinophils Relative: 0 % (ref 0–5)
Lymphocytes Relative: 7 % — ABNORMAL LOW (ref 12–46)
Lymphocytes Relative: 7 % — ABNORMAL LOW (ref 12–46)
Monocytes Absolute: 0.9 10*3/uL (ref 0.1–1.0)
Monocytes Absolute: 0.9 10*3/uL (ref 0.1–1.0)

## 2011-03-31 LAB — COMPREHENSIVE METABOLIC PANEL
ALT: 10 U/L (ref 0–35)
AST: 13 U/L (ref 0–37)
Alkaline Phosphatase: 92 U/L (ref 39–117)
CO2: 28 mEq/L (ref 19–32)
Calcium: 8.7 mg/dL (ref 8.4–10.5)
Chloride: 102 mEq/L (ref 96–112)
GFR calc non Af Amer: >90 mL/min (ref >90)
Glucose, Bld: 95 mg/dL (ref 70–99)
Potassium: 3.8 mEq/L (ref 3.5–5.1)
Sodium: 137 mEq/L (ref 135–145)
Total Bilirubin: 0.3 mg/dL (ref 0.3–1.2)

## 2011-03-31 LAB — BASIC METABOLIC PANEL
CO2: 26 mEq/L (ref 19–32)
Calcium: 8.5 mg/dL (ref 8.4–10.5)
Creatinine, Ser: 0.7 mg/dL (ref 0.50–1.10)
GFR calc non Af Amer: >90 mL/min (ref >90)
Sodium: 132 mEq/L — ABNORMAL LOW (ref 135–145)

## 2011-03-31 LAB — CBC
HCT: 43.6 % (ref 36.0–46.0)
Hemoglobin: 14.3 g/dL (ref 12.0–15.0)
MCH: 30.3 pg (ref 26.0–34.0)
MCHC: 31.9 g/dL (ref 30.0–36.0)
MCV: 95 fL (ref 78.0–100.0)
Platelets: 291 10*3/uL (ref 150–400)
Platelets: 244 10*3/uL (ref 150–400)
RBC: 4.46 MIL/uL (ref 3.87–5.11)
RDW: 14.4 % (ref 11.5–15.5)
WBC: 14.7 10*3/uL — ABNORMAL HIGH (ref 4.0–10.5)
WBC: 12.4 10*3/uL — ABNORMAL HIGH (ref 4.0–10.5)

## 2011-03-31 LAB — URINALYSIS, ROUTINE W REFLEX MICROSCOPIC
Bilirubin Urine: NEGATIVE
Glucose, UA: NEGATIVE mg/dL
Glucose, UA: NEGATIVE mg/dL
Hgb urine dipstick: NEGATIVE
Ketones, ur: NEGATIVE mg/dL
Ketones, ur: NEGATIVE mg/dL
Leukocytes, UA: NEGATIVE
Nitrite: NEGATIVE
Protein, ur: NEGATIVE mg/dL
Protein, ur: NEGATIVE mg/dL
Urobilinogen, UA: 0.2 mg/dL (ref 0.0–1.0)
pH: 6 (ref 5.0–8.0)

## 2011-03-31 LAB — GLUCOSE, CAPILLARY

## 2011-03-31 MED ORDER — METHYLPREDNISOLONE SODIUM SUCC 125 MG IJ SOLR
60.0000 mg | Freq: Four times a day (QID) | INTRAMUSCULAR | Status: DC
  Administered 2011-03-31 – 2011-04-01 (×5): 60 mg via INTRAVENOUS
  Filled 2011-03-31 (×12): qty 2

## 2011-03-31 MED ORDER — MOXIFLOXACIN HCL IN NACL 400 MG/250ML IV SOLN
400.0000 mg | Freq: Once | INTRAVENOUS | Status: AC
  Administered 2011-03-31: 400 mg via INTRAVENOUS
  Filled 2011-03-31: qty 250

## 2011-03-31 MED ORDER — ACETAMINOPHEN 325 MG PO TABS
650.0000 mg | ORAL_TABLET | Freq: Once | ORAL | Status: DC
  Filled 2011-03-31: qty 2

## 2011-03-31 MED ORDER — SENNOSIDES-DOCUSATE SODIUM 8.6-50 MG PO TABS
1.0000 | ORAL_TABLET | Freq: Every day | ORAL | Status: DC | PRN
  Filled 2011-03-31: qty 1

## 2011-03-31 MED ORDER — IBUPROFEN 800 MG PO TABS
800.0000 mg | ORAL_TABLET | Freq: Once | ORAL | Status: AC
  Administered 2011-03-31: 800 mg via ORAL

## 2011-03-31 MED ORDER — ONDANSETRON HCL 4 MG/2ML IJ SOLN
4.0000 mg | Freq: Four times a day (QID) | INTRAMUSCULAR | Status: DC | PRN
  Administered 2011-04-03 – 2011-04-04 (×3): 4 mg via INTRAVENOUS
  Filled 2011-03-31 (×3): qty 2

## 2011-03-31 MED ORDER — ALBUTEROL SULFATE (5 MG/ML) 0.5% IN NEBU
2.5000 mg | INHALATION_SOLUTION | Freq: Four times a day (QID) | RESPIRATORY_TRACT | Status: DC
  Administered 2011-03-31 – 2011-04-02 (×8): 2.5 mg via RESPIRATORY_TRACT
  Filled 2011-03-31 (×6): qty 0.5
  Filled 2011-03-31: qty 1

## 2011-03-31 MED ORDER — IPRATROPIUM BROMIDE 0.02 % IN SOLN
0.5000 mg | Freq: Four times a day (QID) | RESPIRATORY_TRACT | Status: DC
  Administered 2011-03-31 – 2011-04-02 (×8): 0.5 mg via RESPIRATORY_TRACT
  Filled 2011-03-31 (×7): qty 2.5

## 2011-03-31 MED ORDER — MOXIFLOXACIN HCL IN NACL 400 MG/250ML IV SOLN: 400.0000 mg | INTRAVENOUS | Status: DC

## 2011-03-31 MED ORDER — ACETAMINOPHEN 650 MG RE SUPP: 650.0000 mg | Freq: Four times a day (QID) | RECTAL | Status: DC | PRN

## 2011-03-31 MED ORDER — ACETAMINOPHEN 325 MG PO TABS
650.0000 mg | ORAL_TABLET | Freq: Four times a day (QID) | ORAL | Status: DC | PRN
  Administered 2011-03-31 – 2011-04-03 (×6): 650 mg via ORAL
  Filled 2011-03-31 (×6): qty 2

## 2011-03-31 MED ORDER — ASPIRIN 81 MG PO TABS: 162.0000 mg | ORAL_TABLET | Freq: Every day | ORAL | Status: DC

## 2011-03-31 MED ORDER — ALUM & MAG HYDROXIDE-SIMETH 200-200-20 MG/5ML PO SUSP
30.0000 mL | Freq: Four times a day (QID) | ORAL | Status: DC | PRN
  Administered 2011-04-02: 30 mL via ORAL
  Filled 2011-03-31: qty 30

## 2011-03-31 MED ORDER — ASPIRIN 325 MG PO TABS
162.0000 mg | ORAL_TABLET | Freq: Every day | ORAL | Status: DC
  Administered 2011-03-31: 162 mg via ORAL
  Filled 2011-03-31: qty 1
  Filled 2011-03-31: qty 0.5

## 2011-03-31 MED ORDER — ASPIRIN 81 MG PO CHEW
CHEWABLE_TABLET | ORAL | Status: AC
  Filled 2011-03-31: qty 2

## 2011-03-31 MED ORDER — SODIUM CHLORIDE 0.9 % IV SOLN
Freq: Once | INTRAVENOUS | Status: AC
  Administered 2011-03-31: 13:00:00 via INTRAVENOUS

## 2011-03-31 MED ORDER — GUAIFENESIN-DM 100-10 MG/5ML PO SYRP
5.0000 mL | ORAL_SOLUTION | ORAL | Status: DC | PRN
  Administered 2011-04-02: 5 mL via ORAL
  Filled 2011-03-31: qty 10
  Filled 2011-03-31: qty 5

## 2011-03-31 MED ORDER — IOHEXOL 300 MG/ML  SOLN
100.0000 mL | Freq: Once | INTRAMUSCULAR | Status: AC | PRN
  Administered 2011-03-31: 100 mL via INTRAVENOUS

## 2011-03-31 MED ORDER — MOXIFLOXACIN HCL IN NACL 400 MG/250ML IV SOLN
400.0000 mg | INTRAVENOUS | Status: DC
  Administered 2011-04-01 – 2011-04-05 (×5): 400 mg via INTRAVENOUS
  Filled 2011-03-31 (×6): qty 250

## 2011-03-31 MED ORDER — ALPRAZOLAM 1 MG PO TABS
1.0000 mg | ORAL_TABLET | Freq: Every evening | ORAL | Status: DC | PRN
  Administered 2011-03-31 – 2011-04-03 (×4): 1 mg via ORAL
  Filled 2011-03-31 (×3): qty 1

## 2011-03-31 MED ORDER — IBUPROFEN 800 MG PO TABS
ORAL_TABLET | ORAL | Status: AC
  Administered 2011-03-31: 800 mg via ORAL
  Filled 2011-03-31: qty 1

## 2011-03-31 MED ORDER — ENOXAPARIN SODIUM 40 MG/0.4ML ~~LOC~~ SOLN
40.0000 mg | SUBCUTANEOUS | Status: DC
  Administered 2011-03-31 – 2011-04-06 (×7): 40 mg via SUBCUTANEOUS
  Filled 2011-03-31 (×9): qty 0.4

## 2011-03-31 MED ORDER — ZOLPIDEM TARTRATE 5 MG PO TABS: 5.0000 mg | ORAL_TABLET | Freq: Every evening | ORAL | Status: DC | PRN

## 2011-03-31 MED ORDER — ALPRAZOLAM 0.25 MG PO TABS
ORAL_TABLET | ORAL | Status: AC
  Filled 2011-03-31: qty 4

## 2011-03-31 MED ORDER — METOPROLOL SUCCINATE ER 50 MG PO TB24
50.0000 mg | ORAL_TABLET | Freq: Every day | ORAL | Status: DC
  Administered 2011-03-31 – 2011-04-05 (×3): 50 mg via ORAL
  Filled 2011-03-31 (×9): qty 1

## 2011-03-31 MED ORDER — ONDANSETRON HCL 4 MG PO TABS: 4.0000 mg | ORAL_TABLET | Freq: Four times a day (QID) | ORAL | Status: DC | PRN

## 2011-03-31 MED ORDER — ALBUTEROL SULFATE (5 MG/ML) 0.5% IN NEBU
2.5000 mg | INHALATION_SOLUTION | RESPIRATORY_TRACT | Status: DC | PRN
  Filled 2011-03-31 (×6): qty 0.5

## 2011-03-31 MED ORDER — HYDROMORPHONE HCL PF 1 MG/ML IJ SOLN
0.5000 mg | INTRAMUSCULAR | Status: DC | PRN
  Administered 2011-04-04: 0.5 mg via INTRAVENOUS
  Filled 2011-03-31: qty 1

## 2011-03-31 NOTE — ED Notes (Addendum)
Pt states she has a fever, but upon assessment temperature is 98.1, pt was on 2L nasal cannula, stats O2 89% .nurse encouraged pt to take a few deep breaths threw her nose, pt then reported that she cannot breathe at all threw her nose. Pt placed on non rebreather 15L/min stats )2 100%.  Warm blanket also given to pt

## 2011-03-31 NOTE — ED Notes (Signed)
WUJ:[id number]<JY> Expected date:03/31/11<BR> Expected time: 2:54 AM<BR> Means of arrival:Ambulance<BR> Comments:<BR> Productive cough, fever

## 2011-03-31 NOTE — ED Notes (Signed)
Patient transported to CT 

## 2011-03-31 NOTE — ED Provider Notes (Signed)
Medical screening examination/treatment/procedure(s) were performed by non-physician practitioner and as supervising physician I was immediately available for consultation/collaboration.   Maija Biggers, MD 03/31/11 2358 

## 2011-03-31 NOTE — ED Notes (Signed)
Pt alert and oriented x4. Respirations even and unlabored, bilateral symmetrical rise and fall of chest. Skin warm and dry. In no acute distress. Denies needs.   

## 2011-03-31 NOTE — ED Provider Notes (Signed)
History     CSN: [id number] Arrival date & time: 03/31/2011  3:25 AM   First MD Initiated Contact with Patient 03/31/11 [phone]      Chief Complaint  Patient presents with  . Fever    Cough, finished Z-Pack today    (Consider location/radiation/quality/duration/timing/severity/associated sxs/prior treatment) HPI Comments: Patient was brought into the ED via EMS.  Patient reports that she has had a productive cough for the past 3 weeks, which is becoming progressively worse.  She called her PCP 5 days ago and was prescribed zpack.  She finished the last dose yesterday.  She reports that yesterday she began running a fever.  Her temperature was 102.6 orally at the highest.  She reports that she has been diagnosed with Pneumonia in the past.  Her symptoms have also been associated with some shortness of breath and pleuritic chest pain.  She reports that she does not smoke currently.  She quit smoking 4 years ago.  Patient denies any surgeries in the past 4 weeks.  She denies any prolonged travel in the past 4 weeks.  No hormone replacement therapy.  No prior DVT or PE.  Patient is a 60 y.o. female presenting with shortness of breath. The history is provided by the patient.  Shortness of Breath  Associated symptoms include chest pain, a fever, cough and shortness of breath. Pertinent negatives include no orthopnea, no rhinorrhea, no sore throat and no wheezing.    Past Medical History  Diagnosis Date  . Acute MI 6 years ago  . Hepatitis C   . Pneumonia   . Costal chondritis     Past Surgical History  Procedure Date  . Tumor excision     vocal cords, overy, uterus  . Cholecystectomy     No family history on file.  History  Substance Use Topics  . Smoking status: Not on file  . Smokeless tobacco: Not on file  . Alcohol Use:     OB History    Grav Para Term Preterm Abortions TAB SAB Ect Mult Living                  Review of Systems  Constitutional: Positive for fever,  chills, diaphoresis and activity change.  HENT: Negative for ear pain, sore throat, rhinorrhea, drooling, neck pain, neck stiffness and sinus pressure.   Respiratory: Positive for cough and shortness of breath. Negative for wheezing.   Cardiovascular: Positive for chest pain. Negative for orthopnea and leg swelling.  Gastrointestinal: Negative for nausea, vomiting, abdominal pain, diarrhea and abdominal distention.  Genitourinary: Negative for dysuria, hematuria and difficulty urinating.  Skin: Negative for rash.  Neurological: Negative for dizziness, syncope, weakness, light-headedness, numbness and headaches.    Allergies  Codeine; Lidocaine; and Penicillins  Home Medications   Current Outpatient Rx  Name Route Sig Dispense Refill  . ALPRAZOLAM 1 MG PO TABS Oral Take 1 mg by mouth at bedtime as needed. For sleep     . ASPIRIN 81 MG PO TABS Oral Take 162 mg by mouth daily. 2 81 mg aspirin    . METOPROLOL SUCCINATE 50 MG PO TB24 Oral Take 50 mg by mouth daily.     Marland Kitchen NAPROXEN 500 MG PO TABS Oral Take 500 mg by mouth 2 (two) times daily with a meal. Pain       BP 142/53  Pulse 97  Temp(Src) 102.6 F (39.2 C) (Oral)  Resp 18  Wt 260 lb (117.935 kg)  SpO2 92%  Physical  Exam  Constitutional: She is oriented to person, place, and time. She appears well-developed and well-nourished.       Uncomfortable appearing  HENT:  Head: Normocephalic and atraumatic.  Right Ear: Hearing and tympanic membrane normal.  Left Ear: Hearing and tympanic membrane normal.  Nose: Nose normal.  Mouth/Throat: Oropharynx is clear and moist.  Neck: Normal range of motion. Neck supple.  Cardiovascular: Normal rate, regular rhythm and normal heart sounds.   Pulmonary/Chest: Effort normal. No accessory muscle usage. Not tachypneic. She has decreased breath sounds. She has no wheezes. She has no rhonchi. She has no rales.  Abdominal: Soft. She exhibits no distension. There is no tenderness.  Musculoskeletal:  Normal range of motion.  Neurological: She is alert and oriented to person, place, and time.  Skin: Skin is warm. No rash noted. She is diaphoretic.  Psychiatric: She has a normal mood and affect.    ED Course  Procedures (including critical care time)  Labs Reviewed  CBC - Abnormal; Notable for the following:    WBC 14.7 (*)    All other components within normal limits  DIFFERENTIAL - Abnormal; Notable for the following:    Neutrophils Relative 86 (*)    Neutro Abs 12.6 (*)    Lymphocytes Relative 7 (*)    All other components within normal limits  BASIC METABOLIC PANEL - Abnormal; Notable for the following:    Sodium 132 (*)    Glucose, Bld 112 (*)    All other components within normal limits  URINALYSIS, ROUTINE W REFLEX MICROSCOPIC   Dg Chest 2 View  03/31/2011  *RADIOLOGY REPORT*  Clinical Data: Fever, cough, chest pain.  CHEST - 2 VIEW  Comparison: 07/10/2010  Findings: Mild interstitial prominence without focal consolidation. No pleural effusion or pneumothorax.  No acute osseous abnormality. Cardiomediastinal contours are unchanged, within normal limits. Multilevel degenerative changes.  Surgical clips within the right upper quadrant.  IMPRESSION: Interstitial prominence without focal consolidation.  Original Report Authenticated By: Waneta Martins, M.D.     No diagnosis found. 6:42 AM Patient discussed with [facility].  They report that they will come down to the ED to see the patient.     MDM  Patient hypoxic in the ED.  She was placed on 2L [place] and then had O2 sats of 96 at rest.  Patient failed outpatient antibiotic therapy.  Therefore, feel that patient needs to be admitted for pneumonia.  Although, the chest xray does not have a focal consolidation, patients symptoms and physical exam consistent with pneumonia.        Pascal Lux [facility] 03/31/11 1812

## 2011-03-31 NOTE — ED Notes (Signed)
MD at bedside. 

## 2011-03-31 NOTE — H&P (Signed)
PCP:   Danella Penton., MD   Chief Complaint:  Patient complains of shortness of breath  HPI: Patient is a 60 year old white female past medical history significant for hepatitis C coronary artery disease tobacco use. Patient stated that for the past 3 weeks she has not been feeling well. She has been short of breath and coughing up yellow-green mucus. She called her doctor and was called in a prescription for Z-Pak. She took the Z-Pak she had the last dose yesterday without any improvement. She also complains of nausea and dry heaving. In the emergency room we were called to admit for possibly pneumonia.  Review of Systems:  The patient denies anorexia, fever, weight loss,, vision loss, decreased hearing, hoarseness, chest pain, syncope, dyspnea on exertion, peripheral edema, balance deficits, hemoptysis, abdominal pain, melena, hematochezia, severe indigestion/heartburn, hematuria, incontinence, genital sores, muscle weakness, suspicious skin lesions, transient blindness, difficulty walking, depression, unusual weight change, abnormal bleeding, enlarged lymph nodes, angioedema, and breast masses.  Past Medical History: Past Medical History  Diagnosis Date  . Acute MI 6 years ago  . Hepatitis C   . Pneumonia   . Costal chondritis    Past Surgical History  Procedure Date  . Tumor excision     vocal cords, overy, uterus  . Cholecystectomy     Medications: Prior to Admission medications   Medication Sig Start Date End Date Taking? Authorizing Provider  ALPRAZolam Prudy Feeler) 1 MG tablet Take 1 mg by mouth at bedtime as needed. For sleep    Yes Historical Provider, MD  aspirin 81 MG tablet Take 162 mg by mouth daily. 2 81 mg aspirin   Yes Historical Provider, MD  metoprolol (TOPROL-XL) 50 MG 24 hr tablet Take 50 mg by mouth daily.    Yes Historical Provider, MD  naproxen (NAPROSYN) 500 MG tablet Take 500 mg by mouth 2 (two) times daily with a meal. Pain    Yes Historical Provider, MD     Allergies:   Allergies  Allergen Reactions  . Codeine     Pt states internal bleeding.  . Lidocaine     vomiting  . Penicillins Hives    Social History:  Patient states that she smokes 3 cigarettes per day, no alcohol use.  She used cocaine in the past but none recently. She is married and presently unemployed.  Family History: Mother is alive and healthy. Father died from gunshot wound.  Physical Exam: Filed Vitals:   03/31/11 0706 03/31/11 0715 03/31/11 0754 03/31/11 0755  BP: 97/57  108/67   Pulse: 94 87 91   Temp:   98.1 F (36.7 C)   TempSrc:   Oral   Resp:  23 18   Weight:      SpO2: 96%  95% 95%   General appearance: alert, cooperative and appears stated age Head: Normocephalic, without obvious abnormality, atraumatic Throat: normal findings: lips normal without lesions Resp: clear to auscultation bilaterally, decreased breath sounds throughout. Cardio: regular rate and rhythm, S1, S2 normal, no murmur, click, rub or gallop GI: soft, non-tender; bowel sounds normal; no masses,  no organomegaly Extremities: extremities normal, atraumatic, no cyanosis or edema   Labs on Admission:   [facility] 03/31/11 0435  NA 132*  K 3.7  CL 97  CO2 26  GLUCOSE 112*  BUN 10  CREATININE 0.70  CALCIUM 8.5  MG --  PHOS --   No results found for this basename: AST:2,ALT:2,ALKPHOS:2,BILITOT:2,PROT:2,ALBUMIN:2 in the last 72 hours No results found for this basename: LIPASE:2,AMYLASE:2  in the last 72 hours  Basename 03/31/11 0435  WBC 14.7*  NEUTROABS 12.6*  HGB 13.9  HCT 43.6  MCV 95.0  PLT 291   No results found for this basename: CKTOTAL:3,CKMB:3,CKMBINDEX:3,TROPONINI:3 in the last 72 hours No results found for this basename: TSH,T4TOTAL,FREET3,T3FREE,THYROIDAB in the last 72 hours No results found for this basename: VITAMINB12:2,FOLATE:2,FERRITIN:2,TIBC:2,IRON:2,RETICCTPCT:2 in the last 72 hours  Radiological Exams on Admission: Dg Chest 2 View  03/31/2011   *RADIOLOGY REPORT*  Clinical Data: Fever, cough, chest pain.  CHEST - 2 VIEW  Comparison: 07/10/2010  Findings: Mild interstitial prominence without focal consolidation. No pleural effusion or pneumothorax.  No acute osseous abnormality. Cardiomediastinal contours are unchanged, within normal limits. Multilevel degenerative changes.  Surgical clips within the right upper quadrant.  IMPRESSION: Interstitial prominence without focal consolidation.  Original Report Authenticated By: Waneta Martins, M.D.    Assessment/Plan Acute bronchitis Patient came in with shortness of breath. Her chest x-ray is negative for pneumonia. Her lungs are basically clear except for decreased breath sounds. Will empirically treat her for acute bronchitis and COPD exacerbation as she is a smoker. Will start her on Solu-Medrol Avelox and neck treatments. Will also get CT angiography chest and the blood gas.  With her coughing up yellow-green mucus also get sputum cultures. Will check urine for strep pneumo and Legionella.  .Leukocytosis Leukocytosis is most likely secondary to acute infection.   .Hyponatremia Probably mild dehydration will give her her IV fluids.  Obesity Encourage diet and exercise  Tobacco use Encourage smoking cessation  Coronary artery disease Continue her on aspirin and Toprol.  Earlene Plater MD, [place] 03/31/2011, 9:26 AM

## 2011-04-01 LAB — BASIC METABOLIC PANEL
Calcium: 8.6 mg/dL (ref 8.4–10.5)
GFR calc non Af Amer: >90 mL/min (ref >90)
Potassium: 3.8 mEq/L (ref 3.5–5.1)
Sodium: 136 mEq/L (ref 135–145)

## 2011-04-01 LAB — EXPECTORATED SPUTUM ASSESSMENT W GRAM STAIN, RFLX TO RESP C: Special Requests: NORMAL

## 2011-04-01 LAB — CBC
Platelets: 253 10*3/uL (ref 150–400)
RBC: 4.31 MIL/uL (ref 3.87–5.11)
WBC: 13.5 10*3/uL — ABNORMAL HIGH (ref 4.0–10.5)

## 2011-04-01 LAB — GLUCOSE, CAPILLARY: Glucose-Capillary: 139 mg/dL — ABNORMAL HIGH (ref 70–99)

## 2011-04-01 MED ORDER — ASPIRIN 81 MG PO CHEW
162.0000 mg | CHEWABLE_TABLET | Freq: Every day | ORAL | Status: DC
  Administered 2011-04-01 – 2011-04-06 (×6): 162 mg via ORAL
  Filled 2011-04-01 (×7): qty 2

## 2011-04-01 MED ORDER — METHYLPREDNISOLONE SODIUM SUCC 125 MG IJ SOLR
60.0000 mg | Freq: Two times a day (BID) | INTRAMUSCULAR | Status: DC
  Administered 2011-04-02 (×2): 60 mg via INTRAVENOUS
  Filled 2011-04-01 (×3): qty 0.96

## 2011-04-01 NOTE — Progress Notes (Signed)
Subjective: Feeling better, SOB improved. Still feel sinus congestion.  Objective: Filed Vitals:   04/01/11 0834 04/01/11 0949 04/01/11 1420 04/01/11 1519  BP:  100/59 105/56   Pulse:  90 83   Temp:   98.6 F (37 C)   TempSrc:   Oral   Resp:   18   Height:      Weight:      SpO2: 92%  94% 93%   Weight change: 9.48 kg (20 lb 14.4 oz)  Intake/Output Summary (Last 24 hours) at 04/01/11 1655 Last data filed at 04/01/11 1300  Gross per 24 hour  Intake    730 ml  Output      0 ml  Net    730 ml    General: Alert, awake, oriented x3, in no acute distress.  HEENT: No bruits, no goiter.  Heart: Regular rate and rhythm, without murmurs, rubs, gallops.  Lungs: , bilateral air movement,no wheezes.  Abdomen: Soft, nontender, nondistended, positive bowel sounds.  Neuro: Grossly intact, nonfocal. Extremities: no edema.   Lab Results:  [facility] 04/01/11 0440 03/31/11 0940  NA 136 137  K 3.8 3.8  CL 102 102  CO2 27 28  GLUCOSE 151* 95  BUN 9 10  CREATININE 0.63 0.73  CALCIUM 8.6 8.7  MG -- --  PHOS -- --    Basename 03/31/11 0940  AST 13  ALT 10  ALKPHOS 92  BILITOT 0.3  PROT 6.8  ALBUMIN 2.8*   No results found for this basename: LIPASE:2,AMYLASE:2 in the last 72 hours  Basename 04/01/11 0440 03/31/11 0940 03/31/11 0435  WBC 13.5* 12.4* --  NEUTROABS -- 10.7* 12.6*  HGB 13.8 14.3 --  HCT 42.1 43.0 --  MCV 97.7 96.4 --  PLT 253 244 --    Micro Results: Recent Results (from the past 240 hour(s))  CULTURE, SPUTUM-ASSESSMENT     Status: Normal   Collection Time   04/01/11 12:35 AM      Component Value Range Status Comment   Specimen Description SPUTUM   Final    Special Requests Normal   Final    Sputum evaluation     Final    Value: MICROSCOPIC FINDINGS SUGGEST THAT THIS SPECIMEN IS NOT REPRESENTATIVE OF LOWER RESPIRATORY SECRETIONS. PLEASE RECOLLECT.     CALLED TO S SMITH,RN 0315 04/01/11 D BRADLEY   Report Status 04/01/2011 FINAL   Final      Studies/Results: Dg Chest 2 View  03/31/2011  *RADIOLOGY REPORT*  Clinical Data: Fever, cough, chest pain.  CHEST - 2 VIEW  Comparison: 07/10/2010  Findings: Mild interstitial prominence without focal consolidation. No pleural effusion or pneumothorax.  No acute osseous abnormality. Cardiomediastinal contours are unchanged, within normal limits. Multilevel degenerative changes.  Surgical clips within the right upper quadrant.  IMPRESSION: Interstitial prominence without focal consolidation.  Original Report Authenticated By: Waneta Martins, M.D.   Ct Chest W Contrast  03/31/2011  *RADIOLOGY REPORT*  Clinical Data: Dyspnea and cough.  The patient is a smoker and has a history of pneumonia.  CT CHEST WITH CONTRAST  Technique:  Multidetector CT imaging of the chest was performed following the standard protocol during bolus administration of intravenous contrast.  Contrast: OMNIPAQUE IOHEXOL 300 MG/ML IV SOLN  Comparison: Chest x-ray earlier today.  Findings: The study was not performed as a CTA to exclude pulmonary embolism.  Pulmonary arterial opacification is therefore not optimal.  No obvious central pulmonary emboli identified.  The thoracic aorta is of normal caliber.  The heart size is normal. No evidence of pulmonary infiltrate, edema or pleural effusion.  No pericardial abnormalities.  Some small mediastinal and bilateral hilar lymph nodes are identified which are likely benign. Degenerative changes are present of the spine.  IMPRESSION: No acute findings.  Original Report Authenticated By: Reola Calkins, M.D.    Medications: I have reviewed the patient's current medications.   Patient Active Hospital Problem List: echo Treating for COPD exacerbation. Continue with Avelox, albuterol, ipratropium, solumedrol.  Obesity (03/31/2011)   Hepatitis C (03/31/2011)  Anxiety (03/31/2011) Continue with Xanax PRN.   Tobacco use disorder (03/31/2011)  Leukocytosis  (03/31/2011) Likely secondary to bronchitis and now on solumedrol.         LOS: 1 day   Julizza Sassone M.D.  [facility] 04/01/2011, 4:55 PM

## 2011-04-02 LAB — BASIC METABOLIC PANEL
CO2: 29 mEq/L (ref 19–32)
Chloride: 105 mEq/L (ref 96–112)
GFR calc non Af Amer: >90 mL/min (ref >90)
Glucose, Bld: 143 mg/dL — ABNORMAL HIGH (ref 70–99)
Potassium: 4.7 mEq/L (ref 3.5–5.1)
Sodium: 138 mEq/L (ref 135–145)

## 2011-04-02 LAB — CBC
Hemoglobin: 14.6 g/dL (ref 12.0–15.0)
RBC: 4.61 MIL/uL (ref 3.87–5.11)
WBC: 24.7 10*3/uL — ABNORMAL HIGH (ref 4.0–10.5)

## 2011-04-02 MED ORDER — PREDNISONE 50 MG PO TABS
50.0000 mg | ORAL_TABLET | Freq: Every day | ORAL | Status: DC
  Administered 2011-04-03: 50 mg via ORAL
  Filled 2011-04-02: qty 1

## 2011-04-02 MED ORDER — IPRATROPIUM BROMIDE 0.02 % IN SOLN
0.5000 mg | Freq: Three times a day (TID) | RESPIRATORY_TRACT | Status: DC
  Administered 2011-04-02 – 2011-04-07 (×16): 0.5 mg via RESPIRATORY_TRACT
  Filled 2011-04-02 (×18): qty 2.5

## 2011-04-02 MED ORDER — ALBUTEROL SULFATE (5 MG/ML) 0.5% IN NEBU
2.5000 mg | INHALATION_SOLUTION | Freq: Three times a day (TID) | RESPIRATORY_TRACT | Status: DC
  Administered 2011-04-02 – 2011-04-07 (×16): 2.5 mg via RESPIRATORY_TRACT
  Filled 2011-04-02 (×8): qty 0.5

## 2011-04-02 MED ORDER — ALBUTEROL SULFATE (5 MG/ML) 0.5% IN NEBU
INHALATION_SOLUTION | RESPIRATORY_TRACT | Status: AC
  Administered 2011-04-02: 2.5 mg via RESPIRATORY_TRACT
  Filled 2011-04-02: qty 0.5

## 2011-04-02 NOTE — Progress Notes (Signed)
  Echocardiogram 2D Echocardiogram has been performed.  Juanita Laster Vy Badley, RDCS 04/02/2011, 7:14 AM

## 2011-04-02 NOTE — Progress Notes (Signed)
Subjective: Still feels some chest congestion, tightness, breathing better. Able to ambulate today without significant SOB.  Objective: Filed Vitals:   04/01/11 2120 04/02/11 0140 04/02/11 0522 04/02/11 0807  BP: 84/54  96/58   Pulse: 90  92   Temp: 98.5 F (36.9 C)  98.3 F (36.8 C)   TempSrc: Oral  Oral   Resp: 18  20   Height:      Weight:      SpO2: 90% 95% 94% 93%   Weight change:   Intake/Output Summary (Last 24 hours) at 04/02/11 1347 Last data filed at 04/02/11 0918  Gross per 24 hour  Intake    850 ml  Output      0 ml  Net    850 ml    General: Alert, awake, oriented x3, in no acute distress.  HEENT: No bruits, no goiter.  Heart: Regular rate and rhythm, without murmurs, rubs, gallops.  Lungs: bilateral air movement, no wheezes.  Abdomen: Soft, nontender, nondistended, positive bowel sounds.  Neuro: Grossly intact, nonfocal. Extremities: no edema.    Lab Results:  [facility] 04/02/11 0455 04/01/11 0440  NA 138 136  K 4.7 3.8  CL 105 102  CO2 29 27  GLUCOSE 143* 151*  BUN 17 9  CREATININE 0.67 0.63  CALCIUM 9.1 8.6  MG -- --  PHOS -- --    Basename 03/31/11 0940  AST 13  ALT 10  ALKPHOS 92  BILITOT 0.3  PROT 6.8  ALBUMIN 2.8*    Basename 04/02/11 0455 04/01/11 0440 03/31/11 0940 03/31/11 0435  WBC 24.7* 13.5* -- --  NEUTROABS -- -- 10.7* 12.6*  HGB 14.6 13.8 -- --  HCT 44.7 42.1 -- --  MCV 97.0 97.7 -- --  PLT 277 253 -- --    Micro Results: Recent Results (from the past 240 hour(s))  CULTURE, SPUTUM-ASSESSMENT     Status: Normal   Collection Time   04/01/11 12:35 AM      Component Value Range Status Comment   Specimen Description SPUTUM   Final    Special Requests Normal   Final    Sputum evaluation     Final    Value: MICROSCOPIC FINDINGS SUGGEST THAT THIS SPECIMEN IS NOT REPRESENTATIVE OF LOWER RESPIRATORY SECRETIONS. PLEASE RECOLLECT.     CALLED TO Ramon Dredge 1610 04/01/11 D BRADLEY   Report Status 04/01/2011 FINAL   Final      Medications: I have reviewed the patient's current medications.   Patient Active Hospital Problem List:  Acute bronchitis (03/31/2011), Rodena Goldmann. Treating for COPD exacerbation. Continue with Avelox, albuterol, ipratropium. I will discontinue solumedrol. Will start prednisone.  ECHO pending.  Obesity (03/31/2011): Counseling provide.    Hepatitis C (03/31/2011): LFT stable.   Anxiety (03/31/2011) Continue with Xanax PRN.  Tobacco use disorder (03/31/2011)  Leukocytosis (03/31/2011)  Likely secondary to bronchitis and now on solumedrol. Monitor for Fever.   Hyponatremia (03/31/2011) Resolved.  Hyperglycemia: likely 2 to steroids.       LOS: 2 days   REGALADO,BELKYS M.D.  [facility] 04/02/2011, 1:47 PM

## 2011-04-02 NOTE — Plan of Care (Signed)
Problem: Phase III Progression Outcomes Goal: O2 sats > or equal to 93% on room air Outcome: Progressing On  2 L/min O2 via [place]

## 2011-04-03 ENCOUNTER — Other Ambulatory Visit: Payer: Self-pay

## 2011-04-03 DIAGNOSIS — I219 Acute myocardial infarction, unspecified: Secondary | ICD-10-CM

## 2011-04-03 LAB — CARDIAC PANEL(CRET KIN+CKTOT+MB+TROPI)
CK, MB: 2.7 ng/mL (ref 0.3–4.0)
Total CK: 167 U/L (ref 7–177)
Troponin I: <0.3 ng/mL (ref <0.30)

## 2011-04-03 LAB — CBC
Platelets: 343 10*3/uL (ref 150–400)
RBC: 4.45 MIL/uL (ref 3.87–5.11)
RDW: 15.2 % (ref 11.5–15.5)
WBC: 21.4 10*3/uL — ABNORMAL HIGH (ref 4.0–10.5)

## 2011-04-03 LAB — GLUCOSE, CAPILLARY: Glucose-Capillary: 94 mg/dL (ref 70–99)

## 2011-04-03 MED ORDER — PREDNISONE 20 MG PO TABS
40.0000 mg | ORAL_TABLET | Freq: Every day | ORAL | Status: DC
  Administered 2011-04-04 – 2011-04-07 (×4): 40 mg via ORAL
  Filled 2011-04-03 (×4): qty 2

## 2011-04-03 MED ORDER — VITAMINS A & D EX OINT
TOPICAL_OINTMENT | CUTANEOUS | Status: AC
  Administered 2011-04-03: 15:00:00
  Filled 2011-04-03: qty 5

## 2011-04-03 MED ORDER — PANTOPRAZOLE SODIUM 40 MG PO TBEC
40.0000 mg | DELAYED_RELEASE_TABLET | Freq: Every day | ORAL | Status: DC
  Administered 2011-04-03: 40 mg via ORAL
  Filled 2011-04-03 (×2): qty 1

## 2011-04-03 MED ORDER — ALBUTEROL SULFATE (5 MG/ML) 0.5% IN NEBU
INHALATION_SOLUTION | RESPIRATORY_TRACT | Status: AC
  Administered 2011-04-03: 2.5 mg via RESPIRATORY_TRACT
  Filled 2011-04-03: qty 0.5

## 2011-04-03 MED ORDER — ALBUTEROL SULFATE (5 MG/ML) 0.5% IN NEBU
INHALATION_SOLUTION | RESPIRATORY_TRACT | Status: AC
  Administered 2011-04-04: 2.5 mg via RESPIRATORY_TRACT
  Filled 2011-04-03: qty 0.5

## 2011-04-03 NOTE — Progress Notes (Signed)
ROOM AIR SATS 75%- OXYGEN ORDERED FROM [facility]; [employer] RN, BSN, MHA

## 2011-04-03 NOTE — Progress Notes (Signed)
Physical Therapy Evaluation Patient Details Name: Edison Wollschlager MRN: [id number] DOB: 1950-11-04 Today's Date: 04/03/2011 Time: [id number] Charge: Delia Heady  Problem List:  Patient Active Problem List  Diagnoses  . Obesity  . Hepatitis C  . Anxiety  . Acute bronchitis  . Cocaine abuse in remission  . Tobacco use disorder  . Leukocytosis  . Hyponatremia    Past Medical History:  Past Medical History  Diagnosis Date  . Acute MI 6 years ago  . Hepatitis C   . Pneumonia   . Costal chondritis   . Hypertension   . Anxiety   . History of kidney stones   . Hx of ectopic pregnancy   . H/O: GI bleed   . Arthritis   . Chronic back pain    Past Surgical History:  Past Surgical History  Procedure Date  . 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 Ectopic pregnancy surgery 1982  . Cholecystectomy 1980  . Abdominal hysterectomy 1995  . Cystoscopy w/ litholapaxy / ehl 1992  . Cardiac catheterization 2006    PT Assessment/Plan/Recommendation PT Assessment Clinical Impression Statement: Pt would benefit from acute skilled PT services in order to improve endurance, independence with gait and stairs, and decrease dyspnea with activity to prepare for return home with spouse. PT Recommendation/Assessment: Patient will need skilled PT in the acute care venue PT Problem List: Decreased activity tolerance;Decreased mobility;Decreased safety awareness;Decreased knowledge of use of DME;Cardiopulmonary status limiting activity PT Therapy Diagnosis : Difficulty walking (dyspnea on exertion) PT Plan PT Frequency: Min 3X/week PT Treatment/Interventions: DME instruction;Gait training;Stair training;Functional mobility training;Balance training;Neuromuscular re-education;Patient/family education PT Recommendation Follow Up Recommendations: Home health PT Equipment Recommended: None recommended by PT PT Goals  Acute Rehab PT Goals PT Goal Formulation: With patient Time For Goal Achievement: 7  days Pt will Transfer Sit to Stand/Stand to Sit: with modified independence PT Transfer Goal: Sit to Stand/Stand to Sit - Progress: Progressing toward goal Pt will Ambulate: >150 feet;with modified independence;with least restrictive assistive device (with SaO2 >92%) PT Goal: Ambulate - Progress: Progressing toward goal Pt will Go Up / Down Stairs: 6-9 stairs;with rail(s);with supervision PT Goal: Up/Down Stairs - Progress: Other (comment) Additional Goals Additional Goal #1: Pt will demonstrate no LOB with functional activities (ie toileting, gait, standing at sink). PT Goal: Additional Goal #1 - Progress: Other (comment)  PT Evaluation Precautions/Restrictions    Prior Functioning  Home Living Lives With: Spouse Type of Home: House Home Layout: One level Home Access: Stairs to enter Entrance Stairs-Rails: Can reach both Entrance Stairs-Number of Steps: 6 Home Adaptive Equipment: Crutches;Bedside commode/3-in-1;Walker - rolling Prior Function Level of Independence: Independent with basic ADLs;Independent with homemaking with ambulation;Independent with gait Cognition Cognition Arousal/Alertness: Awake/alert Overall Cognitive Status: Appears within functional limits for tasks assessed Orientation Level: Oriented X4 Sensation/Coordination   Extremity Assessment RLE Assessment RLE Assessment: Within Functional Limits (per functional observation) LLE Assessment LLE Assessment: Within Functional Limits (per functional observation) Mobility (including Balance) Bed Mobility Bed Mobility: Yes Supine to Sit: 5: Supervision Supine to Sit Details (indicate cue type and reason): HOB elevated to 60* by pt, increased time Transfers Transfers: Yes Sit to Stand: 4: Min assist;With upper extremity assist;From bed Sit to Stand Details (indicate cue type and reason): min/guard Stand to Sit: 4: Min assist;With upper extremity assist;To chair/3-in-1 Stand to Sit Details:  min/guard Ambulation/Gait Ambulation/Gait: Yes Ambulation/Gait Assistance: 4: Min assist Ambulation/Gait Assistance Details (indicate cue type and reason): pt attempted ambulation without assistive device but required  minA for LOB, so agreed to use RW in hallway, pt's SaO2 dropped to 79% on 3L [place] so pursed lip breathing initiated at 200 feet with standing rest break. Pt educated to stop and take rest breaks when fatigued or if she starts to get dizzy. Ambulation Distance (Feet): 400 Feet Assistive device: Rolling walker Gait Pattern: Within Functional Limits Gait velocity: slow cadence  Posture/Postural Control Posture/Postural Control: No significant limitations Balance Balance Assessed: No (LOB requiring minA with ambulation w/o AD) Exercise    End of Session PT - End of Session Activity Tolerance: Patient limited by fatigue (dyspnea with activity) Patient left: in chair;with call bell in reach General Behavior During Session: [facility] for tasks performed Cognition: [facility] for tasks performed  Fareedah Mahler,KATHrine E 04/03/2011, 1:11 PM Pager: [phone]

## 2011-04-03 NOTE — Plan of Care (Signed)
Problem: Phase III Progression Outcomes Goal: Activity at appropriate level-compared to baseline (UP IN CHAIR FOR HEMODIALYSIS)  Outcome: Progressing Up with PT in hallway with O2 at 3l/min to keep O2 sats in 90s and walker.

## 2011-04-03 NOTE — Progress Notes (Signed)
Subjective:  Tightness has improved. Has some indigestion. Wants to go home.   Objective: Filed Vitals:   04/03/11 1005 04/03/11 1015 04/03/11 1016 04/03/11 1244  BP:  83/50  129/85  Pulse:    89  Temp:    98.4 F (36.9 C)  TempSrc:    Oral  Resp:    18  Height:      Weight:      SpO2: 93% 79% 93% 93%   Weight change:   Intake/Output Summary (Last 24 hours) at 04/03/11 1407 Last data filed at 04/03/11 1245  Gross per 24 hour  Intake   1540 ml  Output      0 ml  Net   1540 ml    General: Alert, awake, oriented x3, in no acute distress.  HEENT: No bruits, no goiter.  Heart: Regular rate and rhythm, without murmurs, rubs, gallops.  Lungs: bilateral air movement, no wheezes.  Abdomen: Soft, nontender, nondistended, positive bowel sounds.  Neuro: Grossly intact, nonfocal. Extremities: no edema.    Lab Results:  [facility] 04/02/11 0455 04/01/11 0440  NA 138 136  K 4.7 3.8  CL 105 102  CO2 29 27  GLUCOSE 143* 151*  BUN 17 9  CREATININE 0.67 0.63  CALCIUM 9.1 8.6  MG -- --  PHOS -- --    Basename 04/03/11 0505 04/02/11 0455  WBC 21.4* 24.7*  NEUTROABS -- --  HGB 13.9 14.6  HCT 43.9 44.7  MCV 98.7 97.0  PLT 343 277    Micro Results: Recent Results (from the past 240 hour(s))  CULTURE, SPUTUM-ASSESSMENT     Status: Normal   Collection Time   04/01/11 12:35 AM      Component Value Range Status Comment   Specimen Description SPUTUM   Final    Special Requests Normal   Final    Sputum evaluation     Final    Value: MICROSCOPIC FINDINGS SUGGEST THAT THIS SPECIMEN IS NOT REPRESENTATIVE OF LOWER RESPIRATORY SECRETIONS. PLEASE RECOLLECT.     CALLED TO Ramon Dredge 1610 04/01/11 D BRADLEY   Report Status 04/01/2011 FINAL   Final     Studies/Results: No results found.  Medications: I have reviewed the patient's current medications.   Patient Active Hospital Problem List:   Acute bronchitis (03/31/2011), Rodena Goldmann. Treating for COPD exacerbation ??. Continue  with Avelox, albuterol, ipratropium.  Solumedrol stopped 11-22. Prednisone taper. ECHO showed decrease EF and hypokinesis. Viral cardiomyopathy ???  I will consult Cardio.  I will check BNP, Cardiac enzymes.   Obesity (03/31/2011): Counseling provide.   Hepatitis C (03/31/2011): LFT stable.   Anxiety (03/31/2011) Continue with Xanax PRN.   Tobacco use disorder (03/31/2011)  Leukocytosis (03/31/2011) Likely secondary to bronchitis and now on solumedrol. Monitor for Fever.   Hyponatremia (03/31/2011) Resolved.  Hyperglycemia: likely 2 to steroids.      LOS: 3 days   Hannah Strader M.D.  [facility] 04/03/2011, 2:07 PM

## 2011-04-03 NOTE — Consult Note (Signed)
Admit date: 03/31/2011 Referring Physician: Dr. Hartley Barefoot Primary Physician  : Lennie Muckle M.D. Primary Cardiologist  [facility] Reason for Consultation  Wall motion abnormality noted on echocardiogram  ASSESSMENT: 1. Myocardial infarction 6 years ago treated at [facility], details not known.  2. 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  3. Please call if we may be of further assistance  PLAN: 1. No workup needed.  2. EKG to establish a baseline.   HPI: Cardiology consultation was requested because of an echo demonstrating a regional wall motion abnormality involving the apex. The LV function was in the low-normal range 45-50% EF. I personally reviewed the echo and agree with this interpretation. The patient has no current cardiac symptoms. Her presentation with cough, fever, and white count are totally dissimilar to her presentation with MI which was chest pain and arm numbness.   PMH:   Past Medical History  Diagnosis Date  . Acute MI 6 years ago  . Hepatitis C   . Pneumonia   . Costal chondritis   . Hypertension   . Anxiety   . History of kidney stones   . Hx of ectopic pregnancy   . H/O: GI bleed   . Arthritis   . Chronic back pain      PSH:   Past Surgical History  Procedure Date  . 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 Ectopic pregnancy surgery 1982  . Cholecystectomy 1980  . Abdominal hysterectomy 1995  . Cystoscopy w/ litholapaxy / ehl 1992  . Cardiac catheterization 2006    Allergies:  Codeine; Lidocaine; and Penicillins Prior to Admit Meds:   Prescriptions prior to admission  Medication Sig Dispense Refill  . ALPRAZolam (XANAX) 1 MG tablet Take 1 mg by mouth at bedtime as needed. For sleep       . aspirin 81 MG tablet Take 162 mg by mouth daily. 2 81 mg aspirin      . metoprolol (TOPROL-XL) 50 MG 24 hr tablet Take 50 mg by mouth daily.       . naproxen (NAPROSYN) 500  MG tablet Take 500 mg by mouth 2 (two) times daily with a meal. Pain        Fam HX:   History reviewed. No pertinent family history. Social HX:    History   Social History  . Marital Status: Married    Spouse Name: N/A    Number of Children: N/A  . Years of Education: N/A   Occupational History  . Not on file.   Social History Main Topics  . Smoking status: Current Some Day Smoker -- 0.2 packs/day for 20 years    Types: Cigarettes  . Smokeless tobacco: Never Used  . Alcohol Use: No  . Drug Use: No     History of caocaine abuse  . Sexually Active: Not Currently   Other Topics Concern  . Not on file   Social History Narrative  . No narrative on file     Review of Systems: Pertinent items are noted in HPI.  Physical Exam: Blood pressure 129/85, pulse 89, temperature 98.4 F (36.9 C), temperature source Oral, resp. rate 18, height 5\' 6"  (1.676 m), weight 127.415 kg (280 lb 14.4 oz), SpO2 93.00%. Weight change:   Markedly obese. No distress. Lungs clear. S4 gallop and auscultation. Extremities no edema. Labs:   Lab Results  Component Value Date   WBC 21.4* 04/03/2011   HGB  13.9 04/03/2011   HCT 43.9 04/03/2011   MCV 98.7 04/03/2011   PLT 343 04/03/2011    Lab 04/02/11 0455 03/31/11 0940  NA 138 --  K 4.7 --  CL 105 --  CO2 29 --  BUN 17 --  CREATININE 0.67 --  CALCIUM 9.1 --  PROT -- 6.8  BILITOT -- 0.3  ALKPHOS -- 92  ALT -- 10  AST -- 13  GLUCOSE 143* --   No results found for this basename: PTT   No results found for this basename: INR, PROTIME   Lab Results  Component Value Date   CKTOTAL 167 04/03/2011   CKMB 2.7 04/03/2011   TROPONINI <0.30 04/03/2011     Lab Results  Component Value Date   CHOL  Value: 160        ATP III CLASSIFICATION:  <200     mg/dL   Desirable  045-409  mg/dL   Borderline High  >=811    mg/dL   High        01/09/4781   Lab Results  Component Value Date   HDL 35* 07/11/2010   Lab Results  Component Value Date    LDLCALC  Value: 94        Total Cholesterol/HDL:CHD Risk Coronary Heart Disease Risk Table                     Men   Women  1/2 Average Risk   3.4   3.3  Average Risk       5.0   4.4  2 X Average Risk   9.6   7.1  3 X Average Risk  23.4   11.0        Use the calculated Patient Ratio above and the CHD Risk Table to determine the patient's CHD Risk.        ATP III CLASSIFICATION (LDL):  <100     mg/dL   Optimal  956-213  mg/dL   Near or Above                    Optimal  130-159  mg/dL   Borderline  086-578  mg/dL   High  >469     mg/dL   Very High 10/11/9526   Lab Results  Component Value Date   TRIG 156* 07/11/2010   Lab Results  Component Value Date   CHOLHDL 4.6 07/11/2010   No results found for this basename: LDLDIRECT      Radiology:  No results found. EKG:  None performed since admission    Kayla Stout 04/03/2011 6:32 PM

## 2011-04-03 NOTE — Progress Notes (Signed)
OT Note:  Screened pt for OT.  No acute needs. Kayla Stout, [place] [phone] 04/03/2011

## 2011-04-04 ENCOUNTER — Other Ambulatory Visit: Payer: Self-pay

## 2011-04-04 ENCOUNTER — Other Ambulatory Visit (HOSPITAL_COMMUNITY): Payer: Medicare Other

## 2011-04-04 ENCOUNTER — Inpatient Hospital Stay (HOSPITAL_COMMUNITY): Payer: Medicare Other

## 2011-04-04 DIAGNOSIS — R0602 Shortness of breath: Secondary | ICD-10-CM

## 2011-04-04 DIAGNOSIS — J9600 Acute respiratory failure, unspecified whether with hypoxia or hypercapnia: Secondary | ICD-10-CM

## 2011-04-04 DIAGNOSIS — J810 Acute pulmonary edema: Secondary | ICD-10-CM

## 2011-04-04 LAB — BASIC METABOLIC PANEL
CO2: 37 mEq/L — ABNORMAL HIGH (ref 19–32)
Chloride: 96 mEq/L (ref 96–112)
Creatinine, Ser: 0.76 mg/dL (ref 0.50–1.10)
Glucose, Bld: 96 mg/dL (ref 70–99)

## 2011-04-04 LAB — CBC
HCT: 46.5 % — ABNORMAL HIGH (ref 36.0–46.0)
Hemoglobin: 14 g/dL (ref 12.0–15.0)
MCH: 31 pg (ref 26.0–34.0)
MCHC: 30.1 g/dL (ref 30.0–36.0)
MCV: 102.9 fL — ABNORMAL HIGH (ref 78.0–100.0)
Platelets: 341 K/uL (ref 150–400)
RBC: 4.52 MIL/uL (ref 3.87–5.11)
RDW: 15.4 % (ref 11.5–15.5)
WBC: 14.1 K/uL — ABNORMAL HIGH (ref 4.0–10.5)

## 2011-04-04 MED ORDER — METOCLOPRAMIDE HCL 5 MG/ML IJ SOLN
10.0000 mg | Freq: Four times a day (QID) | INTRAMUSCULAR | Status: DC
  Administered 2011-04-04 – 2011-04-05 (×6): 10 mg via INTRAVENOUS
  Filled 2011-04-04 (×16): qty 2

## 2011-04-04 MED ORDER — PANTOPRAZOLE SODIUM 40 MG PO TBEC
40.0000 mg | DELAYED_RELEASE_TABLET | Freq: Two times a day (BID) | ORAL | Status: DC
  Administered 2011-04-04 – 2011-04-07 (×6): 40 mg via ORAL
  Filled 2011-04-04 (×8): qty 1

## 2011-04-04 MED ORDER — FUROSEMIDE 10 MG/ML IJ SOLN
20.0000 mg | Freq: Once | INTRAMUSCULAR | Status: AC
  Administered 2011-04-04: 20 mg via INTRAVENOUS
  Filled 2011-04-04: qty 2

## 2011-04-04 NOTE — Progress Notes (Signed)
Patient ID: Kayla Stout, female   DOB: May 16, 1950, 60 y.o.   MRN: [id number] @ Subjective:  No SSCP palpitations and dyspnea improved  Objective:  Filed Vitals:   04/04/11 0930 04/04/11 0934 04/04/11 1000 04/04/11 1100  BP:  122/61 133/67 105/67  Pulse:  109 103 104  Temp:      TempSrc:      Resp:   23 26  Height:      Weight:      SpO2: 88%  85% 86%    Intake/Output from previous day: 11/23 0701 - 11/24 0700 In: 480 [P.O.:480] Out: -   Physical Exam: General appearance: alert and no distress Lungs: Decreased BS at bases no active wheezing Heart: regular rate and rhythm, S1, S2 normal, no murmur, click, rub or gallop Abdomen: soft, non-tender; bowel sounds normal; no masses,  no organomegaly Extremities: extremities normal, atraumatic, no cyanosis or edema Pulses: 2+ and symmetric  Lab Results:  Basename 04/04/11 0535 04/03/11 0505  WBC 14.1* 21.4*  HGB 14.0 13.9  PLT 341 343    Basename 04/04/11 0915 04/02/11 0455  NA 137 138  K 4.5 4.7  CL 96 105  CO2 37* 29  GLUCOSE 96 143*  BUN 14 17  CREATININE 0.76 0.67    Basename 04/03/11 1715  TROPONINI <0.30   Imaging: Dg Chest Port 1 View  04/04/2011  *RADIOLOGY REPORT*  Clinical Data: Hypoxia. Shortness of breath.  PORTABLE CHEST - 2 VIEW  Comparison: 03/31/2011.  Findings: Interval development of pulmonary edema.  Interstitial infectious infiltrate is a secondary less likely consideration. Subsegmental atelectasis medial superior left upper lobe and left mid to lower lobe.  Cardiomegaly.  No gross pneumothorax.  IMPRESSION: Interval development of pulmonary edema and subsegmental atelectatic changes as noted above.  Cardiomegaly.  Original Report Authenticated By: Fuller Canada, M.D.    Cardiac Studies:  ECG:  SR poor R wave progression no acute changes   Telemetry: NSR sinus tachycardia no arrythmia  Echo:  04/02/11  - Left ventricle: The cavity size was normal. Systolic function was mildly reduced. The  estimated ejection fraction was in the range of 45% to 50%. Possible hypokinesis of the apical myocardium. - Left atrium: The atrium was mildly dilated.    Medications:     . albuterol  2.5 mg Nebulization TID  . aspirin  162 mg Oral Daily  . enoxaparin  40 mg Subcutaneous Q24H  . furosemide  20 mg Intravenous Once  . ipratropium  0.5 mg Nebulization TID  . metoCLOPramide (REGLAN) injection  10 mg Intravenous Q6H  . metoprolol  50 mg Oral Daily  . moxifloxacin  400 mg Intravenous Q24H  . pantoprazole  40 mg Oral BID AC  . predniSONE  40 mg Oral QAC breakfast  . vitamin A & D      . DISCONTD: pantoprazole  40 mg Oral Q1200  . DISCONTD: predniSONE  50 mg Oral QAC breakfast       Assessment/Plan:  CHF:  Etiology not clear.  EF not that bad and old MI.  No acute coronary syndrome.  She is obese but indicates she has been checked for sleep apnea and does not have it.  She is a smoker with some chronic lung disease as evidened by her CO2 retension.  Continue diuretic, antibiotic coverage and steroids. WBC up some from roids and demargination.  CT negative for PE and no LE edema.  Rhythm is stable Tachycardia from underlying obesity and acute lung issues.  Charlton Haws 04/04/2011, 11:50 AM

## 2011-04-04 NOTE — Progress Notes (Signed)
Patient name: Kayla Stout Medical record number: [id number] Date of birth: 1950-08-02 Age: 60 y.o. Gender: female PCP: Danella Penton., MD  Date of 1st CCM eval  04/04/11 Reason:  Acute resp failure Referred by:  [facility]  Brief history 38 yowf mobidly obese/ active smoker acutely ill x 3weeks with severe cough, rx with zpak and adimt  11/20 with sob/ desats > to ICU am 11/24 with ams/ pc02 102 and ccm consulted by [facility]  Lines/tubes  Culture data/sepsis markers Sputum 11/21 > not acceptable BC x 2  11/24 >>>    Antibiotics Avelox (CAP)  11/21 >>>   Best practice DVT LMWH GI PPI  Protocols/consults  Events/studies 2d Echo Left ventricle: The cavity size was normal. Systolic function was mildly reduced. The estimated ejection fraction was in the range of 45% to 50%. Possible hypokinesis of the apical myocardium. - Left atrium: The atrium was mildly dilated.    HPI: Baseline = amb [place] store, not mall,  s 02 on Advair now and then and still smoking.  Typically sleeps propped up.  abupt onset "crud" x 3 weeks not able to see Hyacinth Meeker, primary at [facility], called in zpak no better, no better with advair.  Only assoc c/o = nausea s overt hb or abd pain  Sleeping ok without nocturnal  or early am exacerbation  of respiratory  c/o's or need for noct saba. Also denies any obvious fluctuation of symptoms with weather or environmental changes or other aggravating or alleviating factors except as outlined above   ROS  At present neg for  any significant sore throat, dysphagia, itching, sneezing,  nasal congestion or excess/ purulent secretions,  fever, chills, sweats, unintended wt loss, pleuritic or exertional cp, hempoptysis, orthopnea pnd or leg swelling.  Also denies presyncope, palpitations, heartburn, abdominal pain, nausea, vomiting, diarrhea  or change in bowel or urinary habits, dysuria,hematuria,  rash, arthralgias, visual complaints, headache, numbness weakness or ataxia.     Past Medical History  Diagnosis Date  . Acute MI 6 years ago  . Hepatitis C   . Pneumonia   . Costal chondritis   . Hypertension   . Anxiety   . History of kidney stones   . Hx of ectopic pregnancy   . H/O: GI bleed   . Arthritis   . Chronic back pain     Past Surgical History  Procedure Date  . 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 Ectopic pregnancy surgery 1982  . Cholecystectomy 1980  . Abdominal hysterectomy 1995  . Cystoscopy w/ litholapaxy / ehl 1992  . Cardiac catheterization 2006    History reviewed. No pertinent family history.  Social History:  reports that she has been smoking Cigarettes.  She has a 5 pack-year smoking history. She has never used smokeless tobacco. She reports that she does not drink alcohol or use illicit drugs.  Allergies:  Allergies  Allergen Reactions  . Codeine     Pt states internal bleeding.  . Lidocaine     vomiting  . Penicillins Hives    Medications:  Prior to Admission medications   Medication Sig Start Date End Date Taking? Authorizing Provider  ALPRAZolam Prudy Feeler) 1 MG tablet Take 1 mg by mouth at bedtime as needed. For sleep    Yes Historical Provider, MD  aspirin 81 MG tablet Take 162 mg by mouth daily. 2 81 mg aspirin   Yes Historical Provider, MD  metoprolol (TOPROL-XL) 50 MG 24 hr tablet Take 50 mg by  mouth daily.    Yes Historical Provider, MD  naproxen (NAPROSYN) 500 MG tablet Take 500 mg by mouth 2 (two) times daily with a meal. Pain    Yes Historical Provider, MD      Temp:  [98.4 F (36.9 C)-99.3 F (37.4 C)] 99.3 F (37.4 C) (11/24 0458) Pulse Rate:  [85-114] 114  (11/24 0600) Resp:  [18-26] 20  (11/24 0600) BP: (83-129)/(50-85) 115/81 mmHg (11/24 0600) SpO2:  [75 %-94 %] 94 % (11/24 0600)    Intake/Output Summary (Last 24 hours) at 04/04/11 0911 Last data filed at 04/03/11 1245  Gross per 24 hour  Intake    480 ml  Output      0 ml  Net    480 ml   Physical exam Obese wf mild increased wob,  sats ok on 1lpm HEENT mild turbinate edema.  Oropharynx no thrush or excess pnd or cobblestoning.  No JVD or cervical adenopathy. Mild accessory muscle hypertrophy. Trachea midline, nl thryroid. Chest was hyperinflated by percussion with diminished breath sounds and moderate increased exp time without wheeze. Hoover sign positive at mid inspiration. Regular rate and rhythm without murmur gallop or rub or increase P2 or edema.  Abd: massively obese, no obviious hsm, no tenerness, poor insp excursion. Ext warm without cyanosis or clubbing.  Neuro intact sensorium, no motor def  radiology    LAB RESULT Lab Results  Component Value Date   CREATININE 0.67 04/02/2011   BUN 17 04/02/2011   NA 138 04/02/2011   K 4.7 04/02/2011   CL 105 04/02/2011   CO2 29 04/02/2011   Lab Results  Component Value Date   WBC 14.1* 04/04/2011   HGB 14.0 04/04/2011   HCT 46.5* 04/04/2011   MCV 102.9* 04/04/2011   PLT 341 04/04/2011   Lab Results  Component Value Date   ALT 10 03/31/2011   AST 13 03/31/2011   ALKPHOS 92 03/31/2011   BILITOT 0.3 03/31/2011   No results found for this basename: INR, PROTIME  pcxr Interval development of pulmonary edema and subsegmental  atelectatic changes as noted above.  Cardiomegaly.    Assessment and Plan  Acute on chronic respiratory Failure COPD plus obesity at baseline with limited ventilatory reserve and now ? pna or chf, agree with empiric abx and lasix.    Obesity  Nausea ? Meds/ ? gerd  Rx ppi and reglan acutely and avoid bibpap in this setting     The patient is critically ill with multiple organ systems failure and requires high complexity decision making for assessment and support, frequent evaluation and titration of therapies, application of advanced monitoring technologies and extensive interpretation of multiple databases. Critical Care Time devoted to patient care services described in this note is 45 minutes.     Sandrea Hughs, MD Pulmonary  and Critical Care Medicine [facility] Cell [phone]

## 2011-04-04 NOTE — Plan of Care (Signed)
Problem: Phase III Progression Outcomes Goal: O2 sats > or equal to 93% on room air Outcome: Completed/Met Date Met:  04/04/11 MD order to O2 sats remian 85%-92%

## 2011-04-04 NOTE — Progress Notes (Signed)
Subjective: Patient sleepy, open eyes to command, following command. She relates that she doesn't know what happens. Breathing better since last night. She doesn't know how she feels. She denies chest pain.   Objective: Filed Vitals:   04/03/11 2010 04/03/11 2201 04/04/11 0458 04/04/11 0600  BP:  96/59 129/71 115/81  Pulse:  104 114 114  Temp:  98.6 F (37 C) 99.3 F (37.4 C)   TempSrc:  Oral Oral   Resp:  18 22 20   Height:      Weight:      SpO2: 94% 92% 93% 94%   Weight change:   Intake/Output Summary (Last 24 hours) at 04/04/11 0745 Last data filed at 04/03/11 1245  Gross per 24 hour  Intake    480 ml  Output      0 ml  Net    480 ml    General:  awake, oriented x3, in no acute distress.  HEENT: No bruits, no goiter.  Heart: Regular rate and rhythm, without murmurs, rubs, gallops.  Lungs: bilateral air movement.  Abdomen: Soft, nontender, nondistended, positive bowel sounds.  Neuro: Grossly intact, nonfocal. Extremities: trace edema.    Lab Results:  [facility] 04/02/11 0455  NA 138  K 4.7  CL 105  CO2 29  GLUCOSE 143*  BUN 17  CREATININE 0.67  CALCIUM 9.1  MG --  PHOS --    Basename 04/04/11 0535 04/03/11 0505  WBC 14.1* 21.4*  NEUTROABS -- --  HGB 14.0 13.9  HCT 46.5* 43.9  MCV 102.9* 98.7  PLT 341 343    Basename 04/03/11 1715  CKTOTAL 167  CKMB 2.7  CKMBINDEX --  TROPONINI <0.30    Basename 04/03/11 2052  POCBNP 1296.0*    Micro Results: Recent Results (from the past 240 hour(s))  CULTURE, SPUTUM-ASSESSMENT     Status: Normal   Collection Time   04/01/11 12:35 AM      Component Value Range Status Comment   Specimen Description SPUTUM   Final    Special Requests Normal   Final    Sputum evaluation     Final    Value: MICROSCOPIC FINDINGS SUGGEST THAT THIS SPECIMEN IS NOT REPRESENTATIVE OF LOWER RESPIRATORY SECRETIONS. PLEASE RECOLLECT.     CALLED TO Ramon Dredge [id number] 04/01/11 D BRADLEY   Report Status 04/01/2011 FINAL   Final      Studies/Results: No results found.  Medications: I have reviewed the patient's current medications.   Patient Active Hospital Problem List:  Subjective:   Tightness has improved. Has some indigestion. Wants to go home.    Objective: Filed Vitals:     04/03/11 1005  04/03/11 1015  04/03/11 1016  04/03/11 1244   BP:    83/50    129/85   Pulse:        89   Temp:        98.4 F (36.9 C)   TempSrc:        Oral   Resp:        18   Height:           Weight:           SpO2:  93%  79%  93%  93%    Weight change:  Intake/Output Summary (Last 24 hours) at 04/03/11 1407 Last data filed at 04/03/11 1245   Gross per 24 hour   Intake    1540 ml   Output       0 ml  Net    1540 ml      General: Alert, awake, oriented x3, in no acute distress.   HEENT: No bruits, no goiter.   Heart: Regular rate and rhythm, without murmurs, rubs, gallops.   Lungs: bilateral air movement, no wheezes.   Abdomen: Soft, nontender, nondistended, positive bowel sounds.   Neuro: Grossly intact, nonfocal. Extremities: no edema.    Lab Results: [facility]  04/02/11 0455  04/01/11 0440   NA  138  136   K  4.7  3.8   CL  105  102   CO2  29  27   GLUCOSE  143*  151*   BUN  17  9   CREATININE  0.67  0.63   CALCIUM  9.1  8.6   MG  --  --   PHOS  --  --    Basename  04/03/11 0505  04/02/11 0455   WBC  21.4*  24.7*   NEUTROABS  --  --   HGB  13.9  14.6   HCT  43.9  44.7   MCV  98.7  97.0   PLT  343  277      Micro Results: Recent Results (from the past 240 hour(s))   CULTURE, SPUTUM-ASSESSMENT     Status: Normal     Collection Time     04/01/11 12:35 AM       Component  Value  Range  Status  Comment     Specimen Description  SPUTUM     Final       Special Requests  Normal     Final       Sputum evaluation        Final       Value:  MICROSCOPIC FINDINGS SUGGEST THAT THIS SPECIMEN IS NOT REPRESENTATIVE OF LOWER RESPIRATORY SECRETIONS. PLEASE RECOLLECT.        CALLED TO Ramon Dredge [phone]  04/01/11 D BRADLEY     Report Status  04/01/2011 FINAL     Final        Studies/Results: No results found.   Medications: I have reviewed the patient's current medications.  Patient Active Hospital Problem List:    Acute Respiratory failure Treating for COPD exacerbation ??. Continue with Avelox, albuterol, ipratropium.   Solumedrol stopped 11-22. Prednisone taper. Patient more hypoxic, Plan Stat ABG. Stat Chest x ray. Will consider lasix , BNP elevated. Will transfer to ICU. Stat EKG. WBC trending down, will reviewed chest x ray to consider broad antibiotics.  ABG with respiratory acidosis, CCM consulted, Dr Sherene Sires will see patient. ,,,  Transfer to ICU, Patient more awake now, sitting in bed. .   Obesity (03/31/2011): Counseling provide.   Hepatitis C (03/31/2011): LFT stable.   Anxiety (03/31/2011) Continue with Xanax PRN.  Carefull with oversedation.   Tobacco use disorder (03/31/2011)  Leukocytosis (03/31/2011) Likely secondary to bronchitis and now on solumedrol. Monitor for Fever. WBC trending down.   Hyponatremia (03/31/2011) Resolved.   Hyperglycemia: likely 2 to steroids.   History of Myocardial infarction (04/03/2011), ECHO with wall motion abnormalities.  Appreciate cardio recommendation. Cardiac enzymes negative. Patient with episode SVT, [place], will inform cardio patient condiction. Continue with metoprolol.       LOS: 4 days   Analyah Mcconnon M.D.  [facility] 04/04/2011, 7:45 AM

## 2011-04-04 NOTE — Progress Notes (Addendum)
0140:  Pt calls out to nurse's desk, crying.  Upon arrival to room, pt is sitting in bed, dyspneic.  Oxygen is off, pt states she just got back from the bathroom.  States "I'm seeing blue spots, I think I got up too fast.  My head feels like it's about to split open with this headache, and I feel like I'm gonna be sick".  RA Sats 79%, O2 reapplied and Sats quickly return to 93%.  Temp 98.3, HR 85 NSR per telemetry, BP 106/67.    Once Sats returned to 90s, pt at that time expressed vision was better and denied seeing any spots.  Dilaudid and Zofran given as ordered.  Instructed pt not to get up without oxygen.  Bed alarm placed for monitoring.   Daphane Shepherd NP made aware of events; no new orders received.  0210:  pt is asleep, resp even and unlabored.  Will continue to monitor.  0500:  HR sustaining 100-120s.  Pt is asleep, awakens easily to verbal stimuli but quickly dozes off after answering a few questions.  Denies pain or discomfort or SOB.  T 99.3, P 114, RR 22, BP 129/71, Sats 89% on 3L.  O2 increased to 6L to maintain Sats of 94%.  Repositioned in bed.  Close monitoring.  0600:  pt had approx 5 sec run of SVT, rate 150s, then returns to ST 110-120.  Again, pt is asleep, no s/s of acute distress.  Awakens easily, denies pain or SOB.  Still very drowsy after meds given earlier.  Radial pulse 114, RR 20, BP 115/81, Sats 94% on 5L [place].  CBG 94.  M.Lynch NP made aware of events; no new orders received.  Will monitor.

## 2011-04-04 NOTE — Progress Notes (Addendum)
ABG critical results reported to Chari Manning RN but would not chart in [facility] system. ABG results are as follows pt on 6 lpm [place] 02: PH 7.194 PCO2 102 P02 88.1 ctHB 14.1 So2 96.9 cHCO3-3 37.9

## 2011-04-04 NOTE — Progress Notes (Signed)
1610- Alert- oriented, sitting on side of bed- states is sleepy. Transferring to room 1235 per Dr. Sunnie Nielsen request for stepdown bed.  Report given to [facility] in ICU.  Pt verbalizes understanding of need for transfer.  No ADN.  Dr. Sunnie Nielsen aware of critical ABG results and CXR results.

## 2011-04-05 ENCOUNTER — Inpatient Hospital Stay (HOSPITAL_COMMUNITY): Payer: Medicare Other

## 2011-04-05 DIAGNOSIS — J810 Acute pulmonary edema: Secondary | ICD-10-CM

## 2011-04-05 DIAGNOSIS — J9600 Acute respiratory failure, unspecified whether with hypoxia or hypercapnia: Secondary | ICD-10-CM

## 2011-04-05 LAB — CBC
Platelets: 308 10*3/uL (ref 150–400)
RBC: 4.58 MIL/uL (ref 3.87–5.11)
RDW: 14.6 % (ref 11.5–15.5)
WBC: 11 10*3/uL — ABNORMAL HIGH (ref 4.0–10.5)

## 2011-04-05 LAB — BASIC METABOLIC PANEL
CO2: 41 mEq/L (ref 19–32)
Chloride: 92 mEq/L — ABNORMAL LOW (ref 96–112)
GFR calc Af Amer: >90 mL/min (ref >90)
Sodium: 139 mEq/L (ref 135–145)

## 2011-04-05 LAB — PROCALCITONIN: Procalcitonin: <0.1 ng/mL

## 2011-04-05 LAB — TSH: TSH: 0.686 u[IU]/mL (ref 0.350–4.500)

## 2011-04-05 LAB — GLUCOSE, CAPILLARY
Glucose-Capillary: 94 mg/dL (ref 70–99)
Glucose-Capillary: 81 mg/dL (ref 70–99)

## 2011-04-05 LAB — PRO B NATRIURETIC PEPTIDE: Pro B Natriuretic peptide (BNP): 4046 pg/mL — ABNORMAL HIGH (ref 0–125)

## 2011-04-05 MED ORDER — FUROSEMIDE 10 MG/ML IJ SOLN
40.0000 mg | Freq: Once | INTRAMUSCULAR | Status: AC
  Administered 2011-04-05: 40 mg via INTRAVENOUS
  Filled 2011-04-05: qty 4

## 2011-04-05 MED ORDER — MOXIFLOXACIN HCL 400 MG PO TABS
400.0000 mg | ORAL_TABLET | Freq: Every day | ORAL | Status: DC
  Administered 2011-04-06 – 2011-04-07 (×2): 400 mg via ORAL
  Filled 2011-04-05 (×3): qty 1

## 2011-04-05 MED ORDER — POTASSIUM CHLORIDE CRYS ER 20 MEQ PO TBCR
40.0000 meq | EXTENDED_RELEASE_TABLET | Freq: Every day | ORAL | Status: DC
  Administered 2011-04-05 – 2011-04-06 (×2): 40 meq via ORAL
  Filled 2011-04-05 (×3): qty 2

## 2011-04-05 MED ORDER — FUROSEMIDE 10 MG/ML IJ SOLN
40.0000 mg | Freq: Every day | INTRAMUSCULAR | Status: DC
  Administered 2011-04-06: 40 mg via INTRAVENOUS
  Filled 2011-04-05 (×2): qty 4

## 2011-04-05 NOTE — Progress Notes (Signed)
Subjective: More awake, feels tightness,unable to take deep breath.  Objective: Filed Vitals:   04/05/11 0620 04/05/11 0621 04/05/11 0624 04/05/11 0625  BP:      Pulse:      Temp:      TempSrc:      Resp:      Height:      Weight:      SpO2: 83% 86% 86% 86%   Weight change:   Intake/Output Summary (Last 24 hours) at 04/05/11 0850 Last data filed at 04/05/11 0700  Gross per 24 hour  Intake   2644 ml  Output    585 ml  Net   2059 ml    General: Alert, awake, oriented x3, in no acute distress.  HEENT: No bruits, no goiter.  Heart: Regular rate and rhythm, without murmurs, rubs, gallops.  Lungs: , bilateral air movement, wheezes.  Abdomen: Soft, nontender, nondistended, positive bowel sounds.  Neuro: Grossly intact, nonfocal. Extremities: trace edema.    Lab Results:  [facility] 04/04/11 0915  NA 137  K 4.5  CL 96  CO2 37*  GLUCOSE 96  BUN 14  CREATININE 0.76  CALCIUM 8.5  MG --  PHOS --    Basename 04/04/11 0535 04/03/11 0505  WBC 14.1* 21.4*  NEUTROABS -- --  HGB 14.0 13.9  HCT 46.5* 43.9  MCV 102.9* 98.7  PLT 341 343    Basename 04/03/11 1715  CKTOTAL 167  CKMB 2.7  CKMBINDEX --  TROPONINI <0.30    Basename 04/05/11 0318 04/03/11 2052  POCBNP 4046.0* 1296.0*    Micro Results: Recent Results (from the past 240 hour(s))  CULTURE, SPUTUM-ASSESSMENT     Status: Normal   Collection Time   04/01/11 12:35 AM      Component Value Range Status Comment   Specimen Description SPUTUM   Final    Special Requests Normal   Final    Sputum evaluation     Final    Value: MICROSCOPIC FINDINGS SUGGEST THAT THIS SPECIMEN IS NOT REPRESENTATIVE OF LOWER RESPIRATORY SECRETIONS. PLEASE RECOLLECT.     CALLED TO S SMITH,RN 0315 04/01/11 D BRADLEY   Report Status 04/01/2011 FINAL   Final   MRSA PCR SCREENING     Status: Normal   Collection Time   04/04/11  9:01 AM      Component Value Range Status Comment   MRSA by PCR NEGATIVE  NEGATIVE  Final      Studies/Results: Dg Chest Port 1 View  04/05/2011  *RADIOLOGY REPORT*  Clinical Data: Check endotracheal tube placement.  PORTABLE CHEST - 1 VIEW  Comparison: 04/04/2011.  Findings: No endotracheal tube is not visualized.  Clinical correlation recommended.  Cardiomegaly.  Pulmonary edema with right hilar prominence probably vascular in origin.  Subsegmental emboli is left base.  IMPRESSION: No endotracheal tube is not visualized.  Clinical correlation recommended.  Cardiomegaly.  Pulmonary edema.  Original Report Authenticated By: Fuller Canada, M.D.   Dg Chest Port 1 View  04/04/2011  *RADIOLOGY REPORT*  Clinical Data: Hypoxia. Shortness of breath.  PORTABLE CHEST - 2 VIEW  Comparison: 03/31/2011.  Findings: Interval development of pulmonary edema.  Interstitial infectious infiltrate is a secondary less likely consideration. Subsegmental atelectasis medial superior left upper lobe and left mid to lower lobe.  Cardiomegaly.  No gross pneumothorax.  IMPRESSION: Interval development of pulmonary edema and subsegmental atelectatic changes as noted above.  Cardiomegaly.  Original Report Authenticated By: Fuller Canada, M.D.    Medications: I have reviewed  the patient's current medications.   Patient Active Hospital Problem List:   Acute Respiratory failure  Treating for COPD exacerbation and acute Systolic HF. Continue with Avelox day 5, albuterol, ipratropium.  Solumedrol stopped 11-22. Prednisone 40 mg daily.  Lasix  40 mg IV daily.  Obesity (03/31/2011): Counseling provide.  Hepatitis C (03/31/2011): LFT stable.  Anxiety (03/31/2011)  Continue with Xanax PRN. Carefull with oversedation.  Tobacco use disorder (03/31/2011)  Leukocytosis (03/31/2011)  Likely secondary to bronchitis and now on solumedrol. Monitor for Fever. WBC pending for this morning.  Hyponatremia (03/31/2011) Resolved.  Hyperglycemia: likely 2 to steroids.   History of Myocardial infarction (04/03/2011), ECHO  with wall motion abnormalities.  Appreciate cardio recommendation.  Cardiac enzymes negative.  Continue with metoprolol.  Lasix 40 mg IV daily. Will add KCL supplement.     LOS: 5 days   Truc Winfree M.D.  [facility] 04/05/2011, 8:50 AM

## 2011-04-05 NOTE — Progress Notes (Signed)
CRITICAL VALUE ALERT  Critical value received: CO2 41  Date of notification:  11/25  Time of notification:  10:58 Critical value read back: yes  Nurse who received alert:  Vernell Leep MD notified (1st page):  Brett Canales Minor NP  Time of first page:  11:00  MD notified (2nd page):  Time of second page:  Responding MD:  Brett Canales Minor  Time MD responded:  11:00

## 2011-04-05 NOTE — Progress Notes (Signed)
This patient is receiving the antibiotic Avelox by the intravenous route. Based on criteria approved by the Pharmacy and Therapeutics Committee, and the Infectious Disease Division, the antibiotic(s) is / are being converted to equivalent oral dose form(s). These criteria include: . Patient being treated for a respiratory tract infection, urinary tract infection, cellulitis, or Clostridium Difficile Associated Diarrhea . The patient is not neutropenic and does not exhibit a GI malabsorption state . The patient is eating (either orally or per tube) and/or has been taking other orally administered medications for at least 24 hours. . The patient is improving clinically (physician assessment and a 24-hour Tmax of < 100.5 F).  If you have questions about this conversion, please contact the pharmacy department. Thank you.  Kayla Stout, PharmD Pager: [phone] 04/05/2011 11:00 AM

## 2011-04-05 NOTE — Progress Notes (Signed)
Patient ID: Kayla Stout, female   DOB: December 03, 1950, 60 y.o.   MRN: [id number] @ Subjective:  No SSCP palpitations harsh productive cough with yellow sputum  Objective:  Filed Vitals:   04/05/11 0621 04/05/11 0624 04/05/11 0625 04/05/11 0909  BP:    114/62  Pulse:      Temp:      TempSrc:      Resp:      Height:      Weight:      SpO2: 86% 86% 86%     Intake/Output from previous day: 11/24 0701 - 11/25 0700 In: 2644 [P.O.:240; I.V.:50; IV Piggyback:254] Out: 585 [Urine:585]  Physical Exam: General appearance: alert and no distress Lungs: Decreased BS at bases rhonchi and expitory wheezes Heart: regular rate and rhythm, S1, S2 normal, no murmur, click, rub or gallop Abdomen: soft, non-tender; bowel sounds normal; no masses,  no organomegaly Extremities: extremities normal, atraumatic, no cyanosis or edema Pulses: 2+ and symmetric  Lab Results:  Basename 04/04/11 0535 04/03/11 0505  WBC 14.1* 21.4*  HGB 14.0 13.9  PLT 341 343    Basename 04/04/11 0915  NA 137  K 4.5  CL 96  CO2 37*  GLUCOSE 96  BUN 14  CREATININE 0.76    Basename 04/03/11 1715  TROPONINI <0.30   Imaging: Dg Chest Port 1 View  04/05/2011  **ADDENDUM** CREATED: 04/05/2011 09:06:06  Third paragraph under findings second sentence should read:  Subsegmental atelectasis left base.  **END ADDENDUM** SIGNED BY: Almedia Balls. Constance Goltz, M.D.   04/05/2011  *RADIOLOGY REPORT*  Clinical Data: Check endotracheal tube placement.  PORTABLE CHEST - 1 VIEW  Comparison: 04/04/2011.  Findings: No endotracheal tube is not visualized.  Clinical correlation recommended.  Cardiomegaly.  Pulmonary edema with right hilar prominence probably vascular in origin.  Subsegmental emboli is left base.  IMPRESSION: No endotracheal tube is not visualized.  Clinical correlation recommended.  Cardiomegaly.  Pulmonary edema.  Original Report Authenticated By: Fuller Canada, M.D.     Cardiac Studies:  ECG:  SR poor R wave progression no  acute changes   Telemetry: NSR sinus tachycardia no arrythmia  Echo:  04/02/11  - Left ventricle: The cavity size was normal. Systolic function was mildly reduced. The estimated ejection fraction was in the range of 45% to 50%. Possible hypokinesis of the apical myocardium. - Left atrium: The atrium was mildly dilated.    Medications:      . albuterol  2.5 mg Nebulization TID  . aspirin  162 mg Oral Daily  . enoxaparin  40 mg Subcutaneous Q24H  . furosemide  20 mg Intravenous Once  . furosemide  40 mg Intravenous Once  . furosemide  40 mg Intravenous Daily  . ipratropium  0.5 mg Nebulization TID  . metoCLOPramide (REGLAN) injection  10 mg Intravenous Q6H  . metoprolol  50 mg Oral Daily  . moxifloxacin  400 mg Intravenous Q24H  . pantoprazole  40 mg Oral BID AC  . potassium chloride  40 mEq Oral Daily  . predniSONE  40 mg Oral QAC breakfast  . DISCONTD: pantoprazole  40 mg Oral Q1200       Assessment/Plan:  CHF:  Etiology not clear.  EF not that bad and old MI.  No acute coronary syndrome.  She is obese but indicates she has been checked for sleep apnea and does not have it.  She is a smoker with some chronic lung disease as evidened by her CO2 retension.  Continue diuretic, antibiotic  coverage and steroids. WBC up some from roids and demargination.  CT negative for PE and no LE edema.  Rhythm is stable Tachycardia from underlying obesity and acute lung issues. Clinically she is acting more like COPD exacerbation.  F/U CXR in am  Dr Katrinka Blazing to decide on risk stratification next week once lungs improved  Charlton Haws 04/05/2011, 9:20 AM

## 2011-04-05 NOTE — Progress Notes (Signed)
Patient name: Alaia Lordi Medical record number: [id number] Date of birth: 1950-09-01 Age: 60 y.o. Gender: female PCP: Danella Penton., MD  Date of 1st CCM eval  04/04/11 Reason:  Acute resp failure Referred by:  [facility]  Brief history 51 yowf mobidly obese/ active smoker acutely ill x 3weeks with severe cough, rx with zpak and adimt  11/20 with sob/ desats > to ICU am 11/24 with ams/ pc02 102 and ccm consulted by [facility]     Culture data/sepsis markers Sputum 11/21 > not acceptable BC x 2  11/24 >>>    Antibiotics Avelox (aecopd)  11/21 >>>   Best practice DVT LMWH GI PPI  Protocols/consults  Events/studies 2d Echo 04/02/11 Left ventricle: The cavity size was normal. Systolic function was mildly reduced. The estimated ejection fraction was in the range of 45% to 50%. Possible hypokinesis of the apical myocardium. - Left atrium: The atrium was mildly dilated. 11/20 CT with contrast > no pe or acute abn  Overnight: Much more alert, no increased wob, denies ha, sats ok      Temp:  [97.8 F (36.6 C)-98.6 F (37 C)] 98.6 F (37 C) (11/25 0400) Pulse Rate:  [81-110] 84  (11/25 0600) Resp:  [17-28] 20  (11/25 0600) BP: (90-133)/(46-69) 101/54 mmHg (11/25 0600) SpO2:  [80 %-96 %] 86 % (11/25 0625) FiO2 (%):  [24 %] 24 % (11/24 0930) Weight:  [277 lb 5.4 oz (125.8 kg)-281 lb 4.9 oz (127.6 kg)] 277 lb 5.4 oz (125.8 kg) (11/25 0000)    Intake/Output Summary (Last 24 hours) at 04/05/11 0836 Last data filed at 04/05/11 0700  Gross per 24 hour  Intake   2644 ml  Output    585 ml  Net   2059 ml   Physical exam Obese wf mild increased wob, sats ok on 1lpm Pt alert, approp nad  No jvd Oropharanx clear Neck supple Lungs with a few scattered exp > insp rhonchi bilaterally RRR no s3 or or sign murmur Abd obese with limited excursion Extr wam with no edema or clubbing noted Neuro  No motor deficits   radiology Cardiomegaly.  Pulmonary edema  LAB RESULT  Lab  04/04/11 0915 04/02/11 0455 04/01/11 0440  NA 137 138 136  K 4.5 4.7 3.8  CL 96 105 102  CO2 37* 29 27  BUN 14 17 9   CREATININE 0.76 0.67 0.63  GLUCOSE 96 143* 151*  ]  Lab 04/04/11 0535 04/03/11 0505 04/02/11 0455  HGB 14.0 13.9 14.6  HCT 46.5* 43.9 44.7  WBC 14.1* 21.4* 24.7*  PLT 341 343 277          Assessment and Plan   Acute on chronic resp failure/ 02 dep Improved and only requiring very low flow 02 - Need to recheck TSH to be complete and rx airways dz but no need intervention at this point  CHF - Cards following, note pos i/o's and trend to much higher BNP> rec diuresis  Obesity - recheck tsh pending  Nausea ? Meds/ ? gerd  Rx ppi and reglan acutely and avoid bibpap in this setting        Discussed with pt and husband need to stop all smoking and lose wt   Sandrea Hughs, MD Pulmonary and Critical Care Medicine [facility] Cell [phone]

## 2011-04-06 LAB — BASIC METABOLIC PANEL
CO2: 42 mEq/L (ref 19–32)
Calcium: 8.7 mg/dL (ref 8.4–10.5)
GFR calc non Af Amer: >90 mL/min (ref >90)
Glucose, Bld: 69 mg/dL — ABNORMAL LOW (ref 70–99)
Potassium: 4.1 mEq/L (ref 3.5–5.1)
Sodium: 140 mEq/L (ref 135–145)

## 2011-04-06 LAB — BLOOD GAS, ARTERIAL
Acid-Base Excess: 5.7 mmol/L — ABNORMAL HIGH (ref 0.0–2.0)
Bicarbonate: 37.9 mEq/L — ABNORMAL HIGH (ref 20.0–24.0)
Patient temperature: 98.6
TCO2: 35.2 mmol/L (ref 0–100)
pH, Arterial: 7.194 — CL (ref 7.350–7.400)

## 2011-04-06 LAB — CBC
Hemoglobin: 14.3 g/dL (ref 12.0–15.0)
MCHC: 31.4 g/dL (ref 30.0–36.0)
Platelets: 324 10*3/uL (ref 150–400)
RBC: 4.58 MIL/uL (ref 3.87–5.11)

## 2011-04-06 LAB — TSH: TSH: 1.133 u[IU]/mL (ref 0.350–4.500)

## 2011-04-06 MED ORDER — LISINOPRIL 2.5 MG PO TABS
2.5000 mg | ORAL_TABLET | Freq: Every day | ORAL | Status: DC
  Filled 2011-04-06 (×2): qty 1

## 2011-04-06 MED ORDER — SIMVASTATIN 20 MG PO TABS
20.0000 mg | ORAL_TABLET | Freq: Every day | ORAL | Status: DC
  Administered 2011-04-06: 20 mg via ORAL
  Filled 2011-04-06 (×2): qty 1

## 2011-04-06 NOTE — Progress Notes (Signed)
Patient Name: Kayla Stout Date of Encounter: 04/06/2011    SUBJECTIVE:Breathing improved. There is no chest pain and denies cough and other complaints.  TELEMETRY:  NSR and ST: Filed Vitals:   04/06/11 0100 04/06/11 0300 04/06/11 0400 04/06/11 0555  BP: 95/55 105/58  106/71  Pulse: 67 72  74  Temp:   98 F (36.7 C)   TempSrc:   Oral   Resp: 17 22  29   Height:      Weight:   124 kg (273 lb 5.9 oz)   SpO2: 89% 91%  93%    Intake/Output Summary (Last 24 hours) at 04/06/11 0723 Last data filed at 04/06/11 0600  Gross per 24 hour  Intake    964 ml  Output   3905 ml  Net  -2941 ml    LABS: Basic Metabolic Panel:  Basename 04/06/11 0330 04/05/11 1014  NA 140 139  K 4.1 3.6  CL 93* 92*  CO2 42* 41*  GLUCOSE 69* 126*  BUN 14 16  CREATININE 0.68 0.71  CALCIUM 8.7 8.5  MG -- --  PHOS -- --   CBC:  Basename 04/06/11 0330 04/05/11 1014  WBC 12.9* 11.0*  NEUTROABS -- --  HGB 14.3 14.5  HCT 45.5 47.2*  MCV 99.3 103.1*  PLT 324 308   Cardiac Enzymes:  Basename 04/03/11 1715  CKTOTAL 167  CKMB 2.7  CKMBINDEX --  TROPONINI <0.30   BNP:  Basename 04/05/11 0318 04/03/11 2052  POCBNP 4046.0* 1296.0*   Hemoglobin A1C: No results found for this basename: HGBA1C in the last 72 hours Fasting Lipid Panel: No results found for this basename: CHOL,HDL,LDLCALC,TRIG,CHOLHDL,LDLDIRECT in the last 72 hours  ECG: ST but no acute change  Radiology/Studies:  CHEST - 2 VIEW  Comparison: 04/05/2011 at 0521 hours.  Findings: 1320 hours. Midline trachea. Normal heart size.  Borderline right hilar prominence is favored to be due to  technique. No pleural effusion or pneumothorax. Pulmonary  interstitial prominence and indistinctness is improved. No  residual congestive failure. Mild right base volume loss.  Surgical clips in the upper abdomen.  IMPRESSION:  1. Improved, resolved congestive heart failure. Residual  interstitial thickening favored to represent  peribronchial  thickening secondary chronic bronchitis/smoking.  2. Mild right base airspace disease, likely atelectasis.  Original Report Authenticated By: Consuello Bossier, M.D   Physical Exam: Blood pressure 106/71, pulse 74, temperature 98 F (36.7 C), temperature source Oral, resp. rate 29, height 5\' 1"  (1.549 m), weight 124 kg (273 lb 5.9 oz), SpO2 93.00%. Weight change: -3.6 kg (-7 lb 15 oz)   No murmur. Chest with faint wheezes. No rales. No gallop or murmur.  ASSESSMENT: 1. Acute combined diastolic/ systolic heart failure, ? Cause. No evidence MI.  Probably volume overload from IV fluids 2. CAD with prior MI, 2006 3. COPD/Bronchitis/Asthma  Plan: 1. Continue diuresis 2. ACE therapy as tolerated by BP 3. Aspirin 4. Statin 5. Ischemic eval once stable, and perhaps as op. 6. Follow renal function    Signed, Lesleigh Noe 04/06/2011, 7:23 AM

## 2011-04-06 NOTE — Progress Notes (Signed)
Subjective: Feeling better today. Breathing has improved.  Objective: Filed Vitals:   04/06/11 0300 04/06/11 0400 04/06/11 0555 04/06/11 0800  BP: 105/58  106/71   Pulse: 72  74   Temp:  98 F (36.7 C)  98.1 F (36.7 C)  TempSrc:  Oral  Oral  Resp: 22  29   Height:      Weight:  124 kg (273 lb 5.9 oz)    SpO2: 91%  93%    Weight change: -3.6 kg (-7 lb 15 oz)  Intake/Output Summary (Last 24 hours) at 04/06/11 0842 Last data filed at 04/06/11 0600  Gross per 24 hour  Intake    964 ml  Output   3905 ml  Net  -2941 ml    General: Alert, awake, oriented x3, in no acute distress.  HEENT: No bruits, no goiter.  Heart: Regular rate and rhythm, without murmurs, rubs, gallops.  Lungs:  bilateral air movement, no wheezes, mild crackles.  Abdomen: Soft, nontender, nondistended, positive bowel sounds.  Neuro: Grossly intact, nonfocal. Extremities: trace edema.   Lab Results:  [facility] 04/06/11 0330 04/05/11 1014  NA 140 139  K 4.1 3.6  CL 93* 92*  CO2 42* 41*  GLUCOSE 69* 126*  BUN 14 16  CREATININE 0.68 0.71  CALCIUM 8.7 8.5  MG -- --  PHOS -- --   Basename 04/06/11 0330 04/05/11 1014  WBC 12.9* 11.0*  NEUTROABS -- --  HGB 14.3 14.5  HCT 45.5 47.2*  MCV 99.3 103.1*  PLT 324 308    Basename 04/03/11 1715  CKTOTAL 167  CKMB 2.7  CKMBINDEX --  TROPONINI <0.30    Basename 04/05/11 0318 04/03/11 2052  POCBNP 4046.0* 1296.0*   Basename 04/05/11 1014  TSH 0.686  T4TOTAL --  T3FREE --  THYROIDAB --   Micro Results: Recent Results (from the past 240 hour(s))  CULTURE, SPUTUM-ASSESSMENT     Status: Normal   Collection Time   04/01/11 12:35 AM      Component Value Range Status Comment   Specimen Description SPUTUM   Final    Special Requests Normal   Final    Sputum evaluation     Final    Value: MICROSCOPIC FINDINGS SUGGEST THAT THIS SPECIMEN IS NOT REPRESENTATIVE OF LOWER RESPIRATORY SECRETIONS. PLEASE RECOLLECT.     CALLED TO S SMITH,RN 0315 04/01/11 D  BRADLEY   Report Status 04/01/2011 FINAL   Final   MRSA PCR SCREENING     Status: Normal   Collection Time   04/04/11  9:01 AM      Component Value Range Status Comment   MRSA by PCR NEGATIVE  NEGATIVE  Final   CULTURE, BLOOD (ROUTINE X 2)     Status: Normal (Preliminary result)   Collection Time   04/04/11  9:15 AM      Component Value Range Status Comment   Specimen Description BLOOD RIGHT ARM   Final    Special Requests     Final    Value: BOTTLES DRAWN AEROBIC AND ANAEROBIC 4CC BOTH BOTTLES   Setup Time [id number]   Final    Culture     Final    Value:        BLOOD CULTURE RECEIVED NO GROWTH TO DATE CULTURE WILL BE HELD FOR 5 DAYS BEFORE ISSUING A FINAL NEGATIVE REPORT   Report Status PENDING   Incomplete   CULTURE, BLOOD (ROUTINE X 2)     Status: Normal (Preliminary result)   Collection Time  04/04/11  9:20 AM      Component Value Range Status Comment   Specimen Description BLOOD RIGHT HAND   Final    Special Requests     Final    Value: BOTTLES DRAWN AEROBIC AND ANAEROBIC 5CC BOTH BOTTLES   Setup Time [id number]   Final    Culture     Final    Value:        BLOOD CULTURE RECEIVED NO GROWTH TO DATE CULTURE WILL BE HELD FOR 5 DAYS BEFORE ISSUING A FINAL NEGATIVE REPORT   Report Status PENDING   Incomplete     Studies/Results: Dg Chest 2 View  04/05/2011  *RADIOLOGY REPORT*  Clinical Data: Follow up of pulmonary edema.  CHEST - 2 VIEW  Comparison: 04/05/2011 at 0521 hours.  Findings: 1320 hours. Midline trachea.  Normal heart size. Borderline right hilar prominence is favored to be due to technique. No pleural effusion or pneumothorax.  Pulmonary interstitial prominence and indistinctness is improved.  No residual congestive failure.  Mild right base volume loss. Surgical clips in the upper abdomen.  IMPRESSION:  1.  Improved,  resolved congestive heart failure.  Residual interstitial thickening favored to represent peribronchial thickening secondary chronic  bronchitis/smoking. 2.  Mild right base airspace disease, likely atelectasis.  Original Report Authenticated By: Consuello Bossier, M.D.   Dg Chest Port 1 View  04/05/2011  **ADDENDUM** CREATED: 04/05/2011 09:06:06  Third paragraph under findings second sentence should read:  Subsegmental atelectasis left base.  **END ADDENDUM** SIGNED BY: Almedia Balls. Constance Goltz, M.D.   04/05/2011  *RADIOLOGY REPORT*  Clinical Data: Check endotracheal tube placement.  PORTABLE CHEST - 1 VIEW  Comparison: 04/04/2011.  Findings: No endotracheal tube is not visualized.  Clinical correlation recommended.  Cardiomegaly.  Pulmonary edema with right hilar prominence probably vascular in origin.  Subsegmental emboli is left base.  IMPRESSION: No endotracheal tube is not visualized.  Clinical correlation recommended.  Cardiomegaly.  Pulmonary edema.  Original Report Authenticated By: Fuller Canada, M.D.    Medications: I have reviewed the patient's current medications.  Acute Respiratory failure  Treating for COPD exacerbation and acute Systolic HF. Continue with Avelox day 5/10, albuterol, ipratropium.  Solumedrol stopped 11-22. Prednisone 40 mg daily.  Lasix 40 mg IV daily.   Obesity (03/31/2011): Counseling provide.  Hepatitis C (03/31/2011): LFT stable.  Anxiety (03/31/2011)  Continue with Xanax PRN. Carefull with oversedation.  Tobacco use disorder (03/31/2011)  Leukocytosis (03/31/2011)  Likely secondary to bronchitis and now on solumedrol. Monitor for Fever. WBC stable.  Hyponatremia (03/31/2011) Resolved.  Hyperglycemia: likely 2 to steroids.   History of Myocardial infarction (04/03/2011), ECHO with wall motion abnormalities.  Appreciate cardio recommendation. Probably work up outpatient.  Cardiac enzymes negative.  Continue with metoprolol, lisinopril added 11/26.  Lasix 40 mg IV daily.  KCL supplement.        LOS: 6 days   REGALADO,BELKYS M.D.  [facility] 04/06/2011, 8:42 AM

## 2011-04-06 NOTE — Progress Notes (Signed)
Physical Therapy Treatment Patient Details Name: Darianna Amy MRN: [id number] DOB: Jun 01, 1950 Today's Date: 04/06/2011 Time: 1340-1406  2G PT Assessment/Plan  PT - Assessment/Plan Comments on Treatment Session: Pt tolerated session well but still limited by activity tolerance.  PT Plan: Discharge plan remains appropriate Follow Up Recommendations: Home health PT PT Goals  Acute Rehab PT Goals PT Transfer Goal: Sit to Stand/Stand to Sit - Progress: Progressing toward goal PT Goal: Ambulate - Progress: Progressing toward goal Additional Goals PT Goal: Additional Goal #1 - Progress: Progressing toward goal  PT Treatment Precautions/Restrictions  Restrictions Weight Bearing Restrictions: No Mobility (including Balance) Bed Mobility Bed Mobility: Yes Supine to Sit: 6: Modified independent (Device/Increase time) Transfers Transfers: Yes Sit to Stand: 5: Supervision Stand to Sit: 5: Supervision Ambulation/Gait Ambulation/Gait: Yes Ambulation/Gait Assistance: Other (comment) (Min-guard assist) Ambulation/Gait Assistance Details (indicate cue type and reason): Attempted ambulation without assistive device but pt required 2 hand support for stability. O2 sats dropped to 87% on 2L O2 with ambulation. 1 standing rest break needed. Ambulation Distance (Feet): 400 Feet Assistive device: Rolling walker    Exercise    End of Session PT - End of Session Equipment Utilized During Treatment: Gait belt Activity Tolerance: Patient limited by fatigue;Other (comment) (Dyspnea with activity) Patient left: in bed;with call bell in reach General Behavior During Session: [facility] for tasks performed Cognition: [facility] for tasks performed  Rebeca Alert [facility] 04/06/2011, 2:31 PM

## 2011-04-07 DIAGNOSIS — I5021 Acute systolic (congestive) heart failure: Secondary | ICD-10-CM | POA: Diagnosis not present

## 2011-04-07 LAB — BASIC METABOLIC PANEL
BUN: 20 mg/dL (ref 6–23)
CO2: 36 mEq/L — ABNORMAL HIGH (ref 19–32)
Calcium: 9 mg/dL (ref 8.4–10.5)
Creatinine, Ser: 0.72 mg/dL (ref 0.50–1.10)
GFR calc non Af Amer: >90 mL/min (ref >90)
Glucose, Bld: 82 mg/dL (ref 70–99)
Sodium: 137 mEq/L (ref 135–145)

## 2011-04-07 LAB — GLUCOSE, CAPILLARY

## 2011-04-07 MED ORDER — LISINOPRIL 10 MG PO TABS: 10.0000 mg | ORAL_TABLET | Freq: Every day | ORAL | Status: AC

## 2011-04-07 MED ORDER — IPRATROPIUM-ALBUTEROL 18-103 MCG/ACT IN AERO: 2.0000 | INHALATION_SPRAY | Freq: Four times a day (QID) | RESPIRATORY_TRACT | Status: AC

## 2011-04-07 MED ORDER — LISINOPRIL 10 MG PO TABS
10.0000 mg | ORAL_TABLET | Freq: Every day | ORAL | Status: DC
  Filled 2011-04-07: qty 1

## 2011-04-07 MED ORDER — MOXIFLOXACIN HCL 400 MG PO TABS: 400.0000 mg | ORAL_TABLET | Freq: Every day | ORAL | Status: AC

## 2011-04-07 MED ORDER — GUAIFENESIN-DM 100-10 MG/5ML PO SYRP: 5.0000 mL | ORAL_SOLUTION | ORAL | Status: AC | PRN

## 2011-04-07 MED ORDER — PREDNISONE 20 MG PO TABS: ORAL_TABLET | ORAL | Status: DC

## 2011-04-07 MED ORDER — HYDROCHLOROTHIAZIDE 12.5 MG PO CAPS
12.5000 mg | ORAL_CAPSULE | Freq: Every day | ORAL | Status: DC
  Filled 2011-04-07: qty 1

## 2011-04-07 MED ORDER — HYDROCHLOROTHIAZIDE 12.5 MG PO CAPS: 12.5000 mg | ORAL_CAPSULE | Freq: Every day | ORAL | Status: AC

## 2011-04-07 MED ORDER — SIMVASTATIN 20 MG PO TABS: 20.0000 mg | ORAL_TABLET | Freq: Every day | ORAL | Status: AC

## 2011-04-07 MED ORDER — PANTOPRAZOLE SODIUM 40 MG PO TBEC: 40.0000 mg | DELAYED_RELEASE_TABLET | Freq: Two times a day (BID) | ORAL | Status: AC

## 2011-04-07 NOTE — Progress Notes (Signed)
Patient discharged home with spouse from Step Down unit. Vital signs are stable. Oxygen saturation is ok between 85-91% per Dr. Sherene Sires. Patient has been educated on medications, discharge instructions, care and follow-up instructions. All belongings were accounted for upon discharge.

## 2011-04-07 NOTE — Progress Notes (Signed)
`   Patient Name: Kayla Stout Date of Encounter: 04/07/2011    SUBJECTIVE: Begging to go home. She denies chest pain. She has not had wheezing. There no palpitations. There is no dizziness or near syncope with standing. Cough has resolved.  TELEMETRY:  Sinus tachycardia: Filed Vitals:   04/06/11 2002 04/07/11 0000 04/07/11 0400 04/07/11 0600  BP: 111/68 107/67  115/73  Pulse: 82 68 74 67  Temp:  97.5 F (36.4 C) 97.9 F (36.6 C)   TempSrc:  Oral Oral   Resp: 27 20 19 19   Height:      Weight:   126.3 kg (278 lb 7.1 oz)   SpO2: 92% 94% 94% 93%    Intake/Output Summary (Last 24 hours) at 04/07/11 0636 Last data filed at 04/07/11 0600  Gross per 24 hour  Intake   1524 ml  Output   3050 ml  Net  -1526 ml    LABS: Basic Metabolic Panel:  Basename 04/07/11 0305 04/06/11 0330  NA 137 140  K 3.8 4.1  CL 94* 93*  CO2 36* 42*  GLUCOSE 82 69*  BUN 20 14  CREATININE 0.72 0.68  CALCIUM 9.0 8.7  MG -- --  PHOS -- --   CBC:  Basename 04/06/11 0330 04/05/11 1014  WBC 12.9* 11.0*  NEUTROABS -- --  HGB 14.3 14.5  HCT 45.5 47.2*  MCV 99.3 103.1*  PLT 324 308   Cardiac Enzymes: No results found for this basename: CKTOTAL:3,CKMB:3,CKMBINDEX:3,TROPONINI:3 in the last 72 hours BNP:  [facility] 04/05/11 0318  POCBNP 4046.0*   Physical Exam: Blood pressure 115/73, pulse 67, temperature 97.9 F (36.6 C), temperature source Oral, resp. rate 19, height 5\' 1"  (1.549 m), weight 126.3 kg (278 lb 7.1 oz), SpO2 93.00%. Weight change: 2.3 kg (5 lb 1.1 oz)    the patient is obese. She is in no distress. She is up walking around. She is not wearing oxygen. The lungs are clear. Cardiac exam is no gallop or murmur. Extremities reveal no edema. Neuro exam is intact   ASSESSMENT:  1. Acute combined systolic and diastolic heart failure, improved with diuresis. Decompensation likely secondary Volume overload from IV fluids while treating pneumonia/bronchitis.  2. Bump in BUN suggests we  are starting to develop volume contraction.  3. Bronchitis/pneumonia/asthma, improved.  4. Hypertension, controlled   Plan:   1. Discontinue IV Lasix  2. Start combatant ACE/hydrochlorothiazide therapy in the form of lisinopril HCT 10/12.5 mg daily.  3. We will establish a plan to perform an outpatient stress test rule out progression of coronary disease.  4. I believe the patient can be safely discharged once her pneumonia and other pulmonary issues are stable per the hospitalist service.  Selinda Eon 04/07/2011, 6:36 AM

## 2011-04-07 NOTE — Progress Notes (Signed)
Patient discharged without home oxygen. Patient refused to wait. She is aware to follow up with her doctor if feeling short of breath. She stated that she will follow up regardless on her next visit.

## 2011-04-07 NOTE — Discharge Summary (Signed)
Admit date: 03/31/2011 Discharge date: 04/07/2011  Primary Care Physician:  Danella Penton., MD   Discharge Diagnoses:     Active Hospital Problems  Diagnoses       Acute Respiratory failure, Secondary to Hf and COPD.        Acute Systolic HF exacerbation. Date Noted   . Acute bronchitis, COPD exacerbation 03/31/2011   . Acute systolic congestive heart failure 04/07/2011   . Myocardial infarction 04/03/2011   . Obesity 03/31/2011   . Hepatitis C 03/31/2011   . Anxiety 03/31/2011   . Tobacco use disorder 03/31/2011   . Leukocytosis 03/31/2011   . Hyponatremia 03/31/2011              DISCHARGE MEDICATION: Current Discharge Medication List    START taking these medications   Details  albuterol-ipratropium (COMBIVENT) 18-103 MCG/ACT inhaler Inhale 2 puffs into the lungs every 6 (six) hours. Qty: 1 Inhaler, Refills: 2    guaiFENesin-dextromethorphan (ROBITUSSIN DM) 100-10 MG/5ML syrup Take 5 mLs by mouth every 4 (four) hours as needed for cough. Qty: 118 mL, Refills: 1    hydrochlorothiazide (MICROZIDE) 12.5 MG capsule Take 1 capsule (12.5 mg total) by mouth daily. Qty: 30 capsule, Refills: 0    lisinopril (PRINIVIL,ZESTRIL) 10 MG tablet Take 1 tablet (10 mg total) by mouth daily. Qty: 30 tablet, Refills: 0    moxifloxacin (AVELOX) 400 MG tablet Take 1 tablet (400 mg total) by mouth daily. Qty: 5 tablet, Refills: 0    pantoprazole (PROTONIX) 40 MG tablet Take 1 tablet (40 mg total) by mouth 2 (two) times daily before a meal. Qty: 60 tablet, Refills: 0    predniSONE (DELTASONE) 20 MG tablet Take 2 tablet for 2 days then 1 tablet for 2 days then half tablet.7 Qty: 7 tablet, Refills: 0    simvastatin (ZOCOR) 20 MG tablet Take 1 tablet (20 mg total) by mouth daily at 6 PM. Qty: 30 tablet, Refills: 0      CONTINUE these medications which have NOT CHANGED   Details  ALPRAZolam (XANAX) 1 MG tablet Take 1 mg by mouth at bedtime as needed. For sleep     aspirin 81 MG  tablet Take 162 mg by mouth daily. 2 81 mg aspirin    metoprolol (TOPROL-XL) 50 MG 24 hr tablet Take 50 mg by mouth daily.       STOP taking these medications     naproxen (NAPROSYN) 500 MG tablet            Consults: Treatment Team:  Lyn Records III   SIGNIFICANT DIAGNOSTIC STUDIES:  Dg Chest 2 View  04/05/2011  *RADIOLOGY REPORT*  Clinical Data: Follow up of pulmonary edema.  CHEST - 2 VIEW  Comparison: 04/05/2011 at 0521 hours.  Findings: 1320 hours. Midline trachea.  Normal heart size. Borderline right hilar prominence is favored to be due to technique. No pleural effusion or pneumothorax.  Pulmonary interstitial prominence and indistinctness is improved.  No residual congestive failure.  Mild right base volume loss. Surgical clips in the upper abdomen.  IMPRESSION:  1.  Improved,  resolved congestive heart failure.  Residual interstitial thickening favored to represent peribronchial thickening secondary chronic bronchitis/smoking. 2.  Mild right base airspace disease, likely atelectasis.  Original Report Authenticated By: Consuello Bossier, M.D.   Dg Chest 2 View  03/31/2011  *RADIOLOGY REPORT*  Clinical Data: Fever, cough, chest pain.  CHEST - 2 VIEW  Comparison: 07/10/2010  Findings: Mild interstitial prominence without focal consolidation. No  pleural effusion or pneumothorax.  No acute osseous abnormality. Cardiomediastinal contours are unchanged, within normal limits. Multilevel degenerative changes.  Surgical clips within the right upper quadrant.  IMPRESSION: Interstitial prominence without focal consolidation.  Original Report Authenticated By: Waneta Martins, M.D.   Ct Chest W Contrast  03/31/2011  *RADIOLOGY REPORT*  Clinical Data: Dyspnea and cough.  The patient is a smoker and has a history of pneumonia.  CT CHEST WITH CONTRAST  Technique:  Multidetector CT imaging of the chest was performed following the standard protocol during bolus administration of intravenous  contrast.  Contrast: OMNIPAQUE IOHEXOL 300 MG/ML IV SOLN  Comparison: Chest x-ray earlier today.  Findings: The study was not performed as a CTA to exclude pulmonary embolism.  Pulmonary arterial opacification is therefore not optimal.  No obvious central pulmonary emboli identified.  The thoracic aorta is of normal caliber.  The heart size is normal. No evidence of pulmonary infiltrate, edema or pleural effusion.  No pericardial abnormalities.  Some small mediastinal and bilateral hilar lymph nodes are identified which are likely benign. Degenerative changes are present of the spine.  IMPRESSION: No acute findings.  Original Report Authenticated By: Reola Calkins, M.D.   Dg Chest Port 1 View  04/05/2011  **ADDENDUM** CREATED: 04/05/2011 09:06:06  Third paragraph under findings second sentence should read:  Subsegmental atelectasis left base.  **END ADDENDUM** SIGNED BY: Almedia Balls. Constance Goltz, M.D.   04/05/2011  *RADIOLOGY REPORT*  Clinical Data: Check endotracheal tube placement.  PORTABLE CHEST - 1 VIEW  Comparison: 04/04/2011.  Findings: No endotracheal tube is not visualized.  Clinical correlation recommended.  Cardiomegaly.  Pulmonary edema with right hilar prominence probably vascular in origin.  Subsegmental emboli is left base.  IMPRESSION: No endotracheal tube is not visualized.  Clinical correlation recommended.  Cardiomegaly.  Pulmonary edema.  Original Report Authenticated By: Fuller Canada, M.D.   Dg Chest Port 1 View  04/04/2011  *RADIOLOGY REPORT*  Clinical Data: Hypoxia. Shortness of breath.  PORTABLE CHEST - 2 VIEW  Comparison: 03/31/2011.  Findings: Interval development of pulmonary edema.  Interstitial infectious infiltrate is a secondary less likely consideration. Subsegmental atelectasis medial superior left upper lobe and left mid to lower lobe.  Cardiomegaly.  No gross pneumothorax.  IMPRESSION: Interval development of pulmonary edema and subsegmental atelectatic changes as  noted above.  Cardiomegaly.  Original Report Authenticated By: Fuller Canada, M.D.     ECHO:- Left ventricle: The cavity size was normal. Systolic               function was mildly reduced. The estimated ejection               fraction was in the range of 45% to 50%. Possible               hypokinesis of the apical myocardium.               Left atrium: The atrium was mildly dilated.     Recent Results (from the past 240 hour(s))  CULTURE, SPUTUM-ASSESSMENT     Status: Normal   Collection Time   04/01/11 12:35 AM      Component Value Range Status Comment   Specimen Description SPUTUM   Final    Special Requests Normal   Final    Sputum evaluation     Final    Value: MICROSCOPIC FINDINGS SUGGEST THAT THIS SPECIMEN IS NOT REPRESENTATIVE OF LOWER RESPIRATORY SECRETIONS. PLEASE RECOLLECT.     CALLED  TO S SMITH,RN 0315 04/01/11 D BRADLEY   Report Status 04/01/2011 FINAL   Final   MRSA PCR SCREENING     Status: Normal   Collection Time   04/04/11  9:01 AM      Component Value Range Status Comment   MRSA by PCR NEGATIVE  NEGATIVE  Final   CULTURE, BLOOD (ROUTINE X 2)     Status: Normal (Preliminary result)   Collection Time   04/04/11  9:15 AM      Component Value Range Status Comment   Specimen Description BLOOD RIGHT ARM   Final    Special Requests     Final    Value: BOTTLES DRAWN AEROBIC AND ANAEROBIC 4CC BOTH BOTTLES   Setup Time [id number]   Final    Culture     Final    Value:        BLOOD CULTURE RECEIVED NO GROWTH TO DATE CULTURE WILL BE HELD FOR 5 DAYS BEFORE ISSUING A FINAL NEGATIVE REPORT   Report Status PENDING   Incomplete   CULTURE, BLOOD (ROUTINE X 2)     Status: Normal (Preliminary result)   Collection Time   04/04/11  9:20 AM      Component Value Range Status Comment   Specimen Description BLOOD RIGHT HAND   Final    Special Requests     Final    Value: BOTTLES DRAWN AEROBIC AND ANAEROBIC 5CC BOTH BOTTLES   Setup Time [id number]   Final    Culture      Final    Value:        BLOOD CULTURE RECEIVED NO GROWTH TO DATE CULTURE WILL BE HELD FOR 5 DAYS BEFORE ISSUING A FINAL NEGATIVE REPORT   Report Status PENDING   Incomplete     BRIEF ADMITTING H & P: Patient is a 60 year old white female past medical history significant for hepatitis C coronary artery disease tobacco use. Patient stated that for the past 3 weeks she has not been feeling well. She has been short of breath and coughing up yellow-green mucus. She called her doctor and was called in a prescription for Z-Pak. She took the Z-Pak she had the last dose yesterday without any improvement. She also complains of nausea and dry heaving. In the emergency room we were called to admit for possibly pneumonia.  Hospital Course:  1-Acute Respiratory failure: Patient was admitted with acute bronchitis, and treated for presumed COPD exacerbation. She was started on Solu-Medrol antibiotics and nebulizer treatments. 2-D echo was ordered and a show ejection fraction 45%. Over course of hospitalization patient and respiratory status decompensated. She was transferred to the step down unit. An ABG showed respiratory acidosis. With a pH of 7.1 and PCO2 of 100. She was initially sleepy, but then she became more awake and alert. She did not require intubation or BiPAP during this hospitalization. Her oxygen was subsequently decreased from 5 L to 2 L. She was given IV lasix. Her respiratory failure was thought to be secondary to acute CHF exacerbation and COPD exacerbation. Patient would need to follow up with her primary care physician. He can referred her to a pulmonologist or patient can followup with Dr. Sherene Sires  as an outpatient. Will defer this to her primary care physician. She would need pulmonary function test. She will be discharged on a prednisone taper. Hydrochlorothiazide. 5 more days of Avelox. The day of discharge patient was in improved condition denies shortness of breath.  Acute Systolic HF: Patient was  treated initially with lasix. 2-D echo result as above. She has some wall motion abnormality noticed on 2-D it. She would need to follow with Dr. Katrinka Blazing for further workup as an outpatient. Cardiac enzymes x3 negative. Her Lasix was discontinued by cardiologist. She will be discharged on hydrochlorothiazide with  blood pressure holding parameters.    Disposition and Follow-up: With PCP and Dr Katrinka Blazing.  Discharge Orders    Future Orders Please Complete By Expires   Diet - low sodium heart healthy      Increase activity slowly        Follow-up Information    Follow up with [facility]   Contact information:   [address]   [place] [address] [phone]           DISCHARGE EXAM: The physical exam is generally normal. Patient appears well, alert and oriented x 3, pleasant, cooperative. Vitals are as noted. Neck supple and free of adenopathy, or masses. No thyromegaly. PERLA. Ears, throat are normal. Lungs are clear to auscultation. Heart sounds are normal, no murmurs, clicks, gallops or rubs. Abdomen is soft, no tenderness, masses or organomegaly. Extremities are normal. Peripheral pulses are normal. Screening neurological exam is normal without focal findings. Skin is normal without suspicious lesions noted.   Blood pressure 115/73, pulse 67, temperature 97.7 F (36.5 C), temperature source Oral, resp. rate 19, height 5\' 1"  (1.549 m), weight 126.3 kg (278 lb 7.1 oz), SpO2 90.00%.   Basename 04/07/11 0305 04/06/11 0330  NA 137 140  K 3.8 4.1  CL 94* 93*  CO2 36* 42*  GLUCOSE 82 69*  BUN 20 14  CREATININE 0.72 0.68  CALCIUM 9.0 8.7  MG -- --  PHOS -- --    Basename 04/06/11 0330 04/05/11 1014  WBC 12.9* 11.0*  NEUTROABS -- --  HGB 14.3 14.5  HCT 45.5 47.2*  MCV 99.3 103.1*  PLT 324 308    Signed: REGALADO,BELKYS M.D. 04/07/2011, 9:54 AM

## 2011-04-10 LAB — CULTURE, BLOOD (ROUTINE X 2): Culture: NO GROWTH

## 2011-09-13 ENCOUNTER — Other Ambulatory Visit: Payer: Self-pay | Admitting: Internal Medicine

## 2012-09-22 IMAGING — CR DG CHEST 1V PORT
1 series · 2 of 2 positions shown · non-contrast
Comparison: 04/04/2011.

***ADDENDUM*** CREATED: 04/05/2011 [DATE]

Third paragraph under findings second sentence should read:
Subsegmental atelectasis left base.
***END ADDENDUM*** SIGNED BY: Parul Joann, M.D.
CLINICAL DATA: Check endotracheal tube placement.
PORTABLE CHEST - 1 VIEW

[Series 1: AP · U · 2 of 2 slices shown]
[im 1/2]
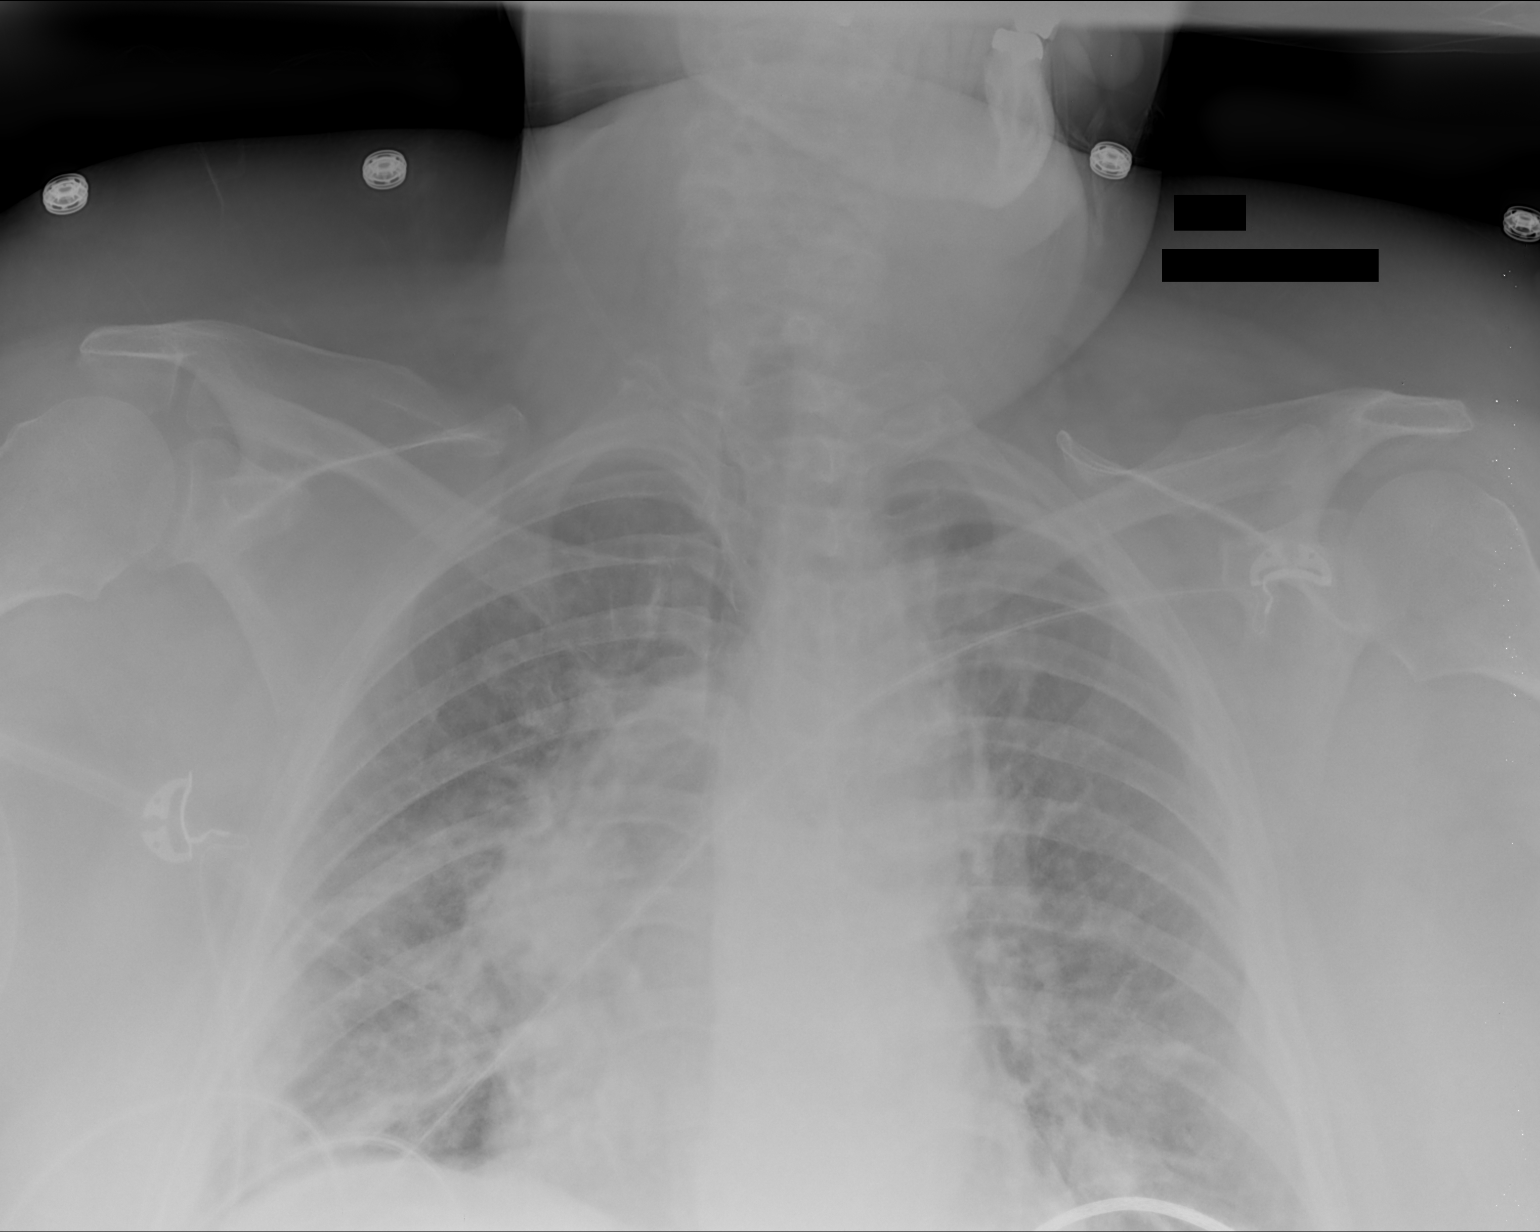
[im 2/2]
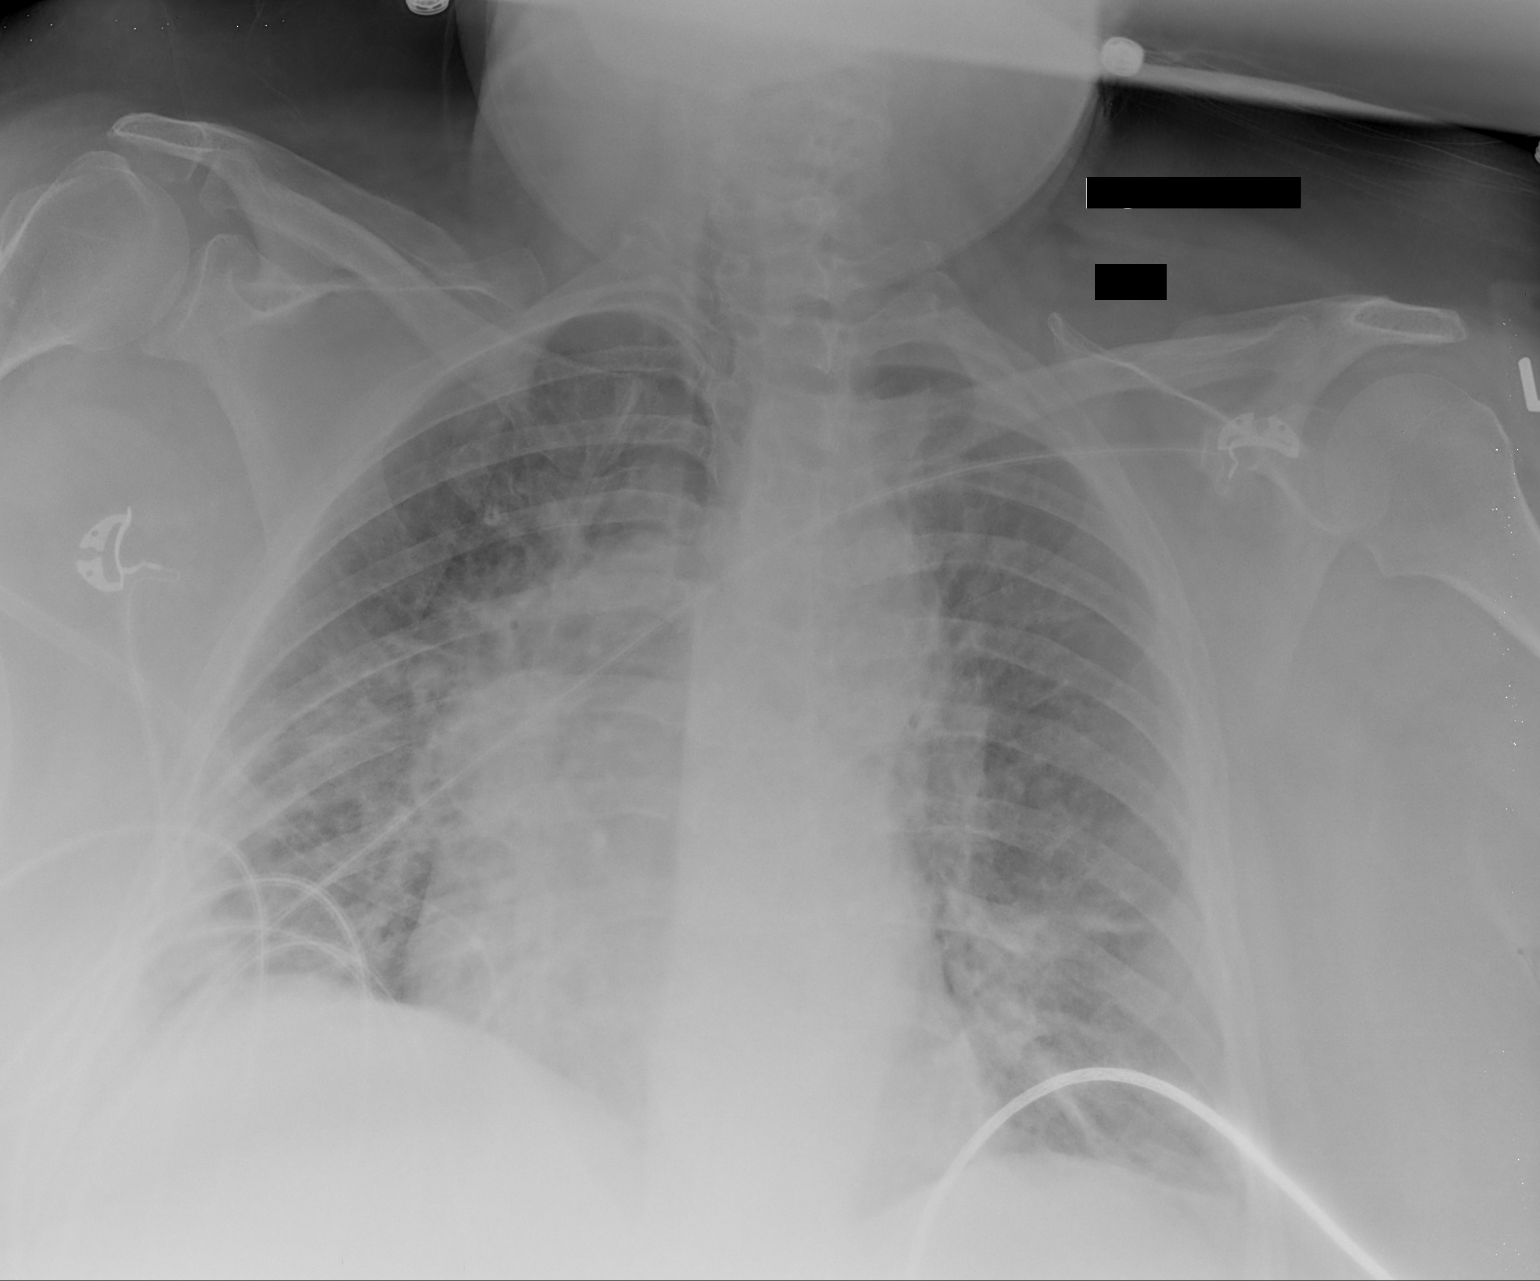

[2 of 2 positions shown; findings below may reference images not displayed]

FINDINGS: No endotracheal tube is not visualized.  Clinical
correlation recommended.

Cardiomegaly.

Pulmonary edema with right hilar prominence probably vascular in
origin.  Subsegmental emboli is left base.
IMPRESSION: No endotracheal tube is not visualized.  Clinical correlation
recommended.

Cardiomegaly.

Pulmonary edema.

## 2013-04-17 ENCOUNTER — Ambulatory Visit: Payer: Self-pay | Admitting: Internal Medicine

## 2013-05-30 ENCOUNTER — Ambulatory Visit: Payer: Self-pay | Admitting: Internal Medicine

## 2013-08-08 ENCOUNTER — Ambulatory Visit: Payer: Self-pay | Admitting: Gastroenterology

## 2013-08-08 LAB — BASIC METABOLIC PANEL
ANION GAP: 5 — AB (ref 7–16)
BUN: 8 mg/dL (ref 7–18)
CALCIUM: 8.5 mg/dL (ref 8.5–10.1)
CO2: 28 mmol/L (ref 21–32)
CREATININE: 0.8 mg/dL (ref 0.60–1.30)
Chloride: 105 mmol/L (ref 98–107)
EGFR (African American): >60
GLUCOSE: 95 mg/dL (ref 65–99)
Osmolality: 274 (ref 275–301)
POTASSIUM: 3.6 mmol/L (ref 3.5–5.1)
Sodium: 138 mmol/L (ref 136–145)

## 2013-08-08 LAB — CBC WITH DIFFERENTIAL/PLATELET
Basophil #: 0.1 10*3/uL (ref 0.0–0.1)
Basophil %: 0.8 %
Eosinophil #: 0.2 10*3/uL (ref 0.0–0.7)
Eosinophil %: 2.1 %
HCT: 46.3 % (ref 35.0–47.0)
HGB: 15 g/dL (ref 12.0–16.0)
LYMPHS ABS: 5 10*3/uL — AB (ref 1.0–3.6)
LYMPHS PCT: 43.2 %
MCH: 31.4 pg (ref 26.0–34.0)
MCHC: 32.4 g/dL (ref 32.0–36.0)
MCV: 97 fL (ref 80–100)
MONO ABS: 1 x10 3/mm — AB (ref 0.2–0.9)
Monocyte %: 8.4 %
NEUTROS ABS: 5.3 10*3/uL (ref 1.4–6.5)
NEUTROS PCT: 45.5 %
PLATELETS: 354 10*3/uL (ref 150–440)
RBC: 4.78 10*6/uL (ref 3.80–5.20)
RDW: 14 % (ref 11.5–14.5)
WBC: 11.6 10*3/uL — AB (ref 3.6–11.0)

## 2013-08-08 LAB — HEPATIC FUNCTION PANEL A (ARMC)
Albumin: 3.2 g/dL — ABNORMAL LOW (ref 3.4–5.0)
Alkaline Phosphatase: 133 U/L — ABNORMAL HIGH
Bilirubin, Direct: 0.2 mg/dL (ref 0.00–0.20)
Bilirubin,Total: 0.8 mg/dL (ref 0.2–1.0)
SGOT(AST): 24 U/L (ref 15–37)
SGPT (ALT): 25 U/L (ref 12–78)
TOTAL PROTEIN: 7.8 g/dL (ref 6.4–8.2)

## 2013-08-08 LAB — PROTIME-INR
INR: 1
PROTHROMBIN TIME: 12.9 s (ref 11.5–14.7)

## 2013-08-28 ENCOUNTER — Ambulatory Visit: Payer: Self-pay | Admitting: Gastroenterology

## 2013-08-28 LAB — CBC WITH DIFFERENTIAL/PLATELET
BASOS ABS: 0.1 10*3/uL (ref 0.0–0.1)
BASOS PCT: 0.5 %
EOS PCT: 2.6 %
Eosinophil #: 0.3 10*3/uL (ref 0.0–0.7)
HCT: 47.3 % — AB (ref 35.0–47.0)
HGB: 15.5 g/dL (ref 12.0–16.0)
LYMPHS ABS: 4.8 10*3/uL — AB (ref 1.0–3.6)
Lymphocyte %: 44.4 %
MCH: 31.8 pg (ref 26.0–34.0)
MCHC: 32.7 g/dL (ref 32.0–36.0)
MCV: 97 fL (ref 80–100)
Monocyte #: 1 x10 3/mm — ABNORMAL HIGH (ref 0.2–0.9)
Monocyte %: 9.5 %
Neutrophil #: 4.6 10*3/uL (ref 1.4–6.5)
Neutrophil %: 43 %
PLATELETS: 344 10*3/uL (ref 150–440)
RBC: 4.87 10*6/uL (ref 3.80–5.20)
RDW: 14.2 % (ref 11.5–14.5)
WBC: 10.7 10*3/uL (ref 3.6–11.0)

## 2013-08-28 LAB — PROTIME-INR
INR: 1
PROTHROMBIN TIME: 13.4 s (ref 11.5–14.7)

## 2013-08-31 LAB — PATHOLOGY REPORT

## 2013-11-23 ENCOUNTER — Ambulatory Visit: Payer: Self-pay | Admitting: Gastroenterology

## 2014-05-31 ENCOUNTER — Ambulatory Visit: Payer: Self-pay | Admitting: Internal Medicine

## 2014-10-24 ENCOUNTER — Encounter
Admission: RE | Admit: 2014-10-24 | Discharge: 2014-10-24 | Disposition: A | Discharge status: Home / Self Care | Payer: Medicare Other | Source: Ambulatory Visit | Attending: Ophthalmology | Admitting: Ophthalmology

## 2014-10-24 DIAGNOSIS — Z01810 Encounter for preprocedural cardiovascular examination: Secondary | ICD-10-CM | POA: Insufficient documentation

## 2014-10-24 DIAGNOSIS — H2511 Age-related nuclear cataract, right eye: Secondary | ICD-10-CM | POA: Insufficient documentation

## 2014-10-24 DIAGNOSIS — I10 Essential (primary) hypertension: Secondary | ICD-10-CM | POA: Diagnosis not present

## 2014-10-25 ENCOUNTER — Encounter: Payer: Self-pay | Admitting: *Deleted

## 2014-10-25 DIAGNOSIS — F329 Major depressive disorder, single episode, unspecified: Secondary | ICD-10-CM | POA: Diagnosis not present

## 2014-10-25 DIAGNOSIS — Z9049 Acquired absence of other specified parts of digestive tract: Secondary | ICD-10-CM | POA: Diagnosis not present

## 2014-10-25 DIAGNOSIS — Z9889 Other specified postprocedural states: Secondary | ICD-10-CM | POA: Diagnosis not present

## 2014-10-25 DIAGNOSIS — J45909 Unspecified asthma, uncomplicated: Secondary | ICD-10-CM | POA: Diagnosis not present

## 2014-10-25 DIAGNOSIS — H2511 Age-related nuclear cataract, right eye: Secondary | ICD-10-CM | POA: Diagnosis not present

## 2014-10-25 DIAGNOSIS — I252 Old myocardial infarction: Secondary | ICD-10-CM | POA: Diagnosis not present

## 2014-10-25 DIAGNOSIS — Z888 Allergy status to other drugs, medicaments and biological substances status: Secondary | ICD-10-CM | POA: Diagnosis not present

## 2014-10-25 DIAGNOSIS — Z79899 Other long term (current) drug therapy: Secondary | ICD-10-CM | POA: Diagnosis not present

## 2014-10-25 DIAGNOSIS — M1990 Unspecified osteoarthritis, unspecified site: Secondary | ICD-10-CM | POA: Diagnosis not present

## 2014-10-25 DIAGNOSIS — H9190 Unspecified hearing loss, unspecified ear: Secondary | ICD-10-CM | POA: Diagnosis not present

## 2014-10-25 DIAGNOSIS — K219 Gastro-esophageal reflux disease without esophagitis: Secondary | ICD-10-CM | POA: Diagnosis not present

## 2014-10-25 DIAGNOSIS — Z8619 Personal history of other infectious and parasitic diseases: Secondary | ICD-10-CM | POA: Diagnosis not present

## 2014-10-25 DIAGNOSIS — Z7982 Long term (current) use of aspirin: Secondary | ICD-10-CM | POA: Diagnosis not present

## 2014-10-25 DIAGNOSIS — Z880 Allergy status to penicillin: Secondary | ICD-10-CM | POA: Diagnosis not present

## 2014-10-25 DIAGNOSIS — Z885 Allergy status to narcotic agent status: Secondary | ICD-10-CM | POA: Diagnosis not present

## 2014-10-25 DIAGNOSIS — Z87891 Personal history of nicotine dependence: Secondary | ICD-10-CM | POA: Diagnosis not present

## 2014-10-25 NOTE — OR Nursing (Signed)
Letter from Dr Daily in chart. Lidocaine and mepivacaine with vasoconstrictors used without problems.

## 2014-10-30 ENCOUNTER — Ambulatory Visit
Admission: RE | Admit: 2014-10-30 | Discharge: 2014-10-30 | Disposition: A | Discharge status: Home / Self Care | Payer: Medicare Other | Source: Ambulatory Visit | Attending: Ophthalmology | Admitting: Ophthalmology

## 2014-10-30 ENCOUNTER — Encounter
Admission: RE | Disposition: A | Discharge status: Home / Self Care | Payer: Self-pay | Source: Ambulatory Visit | Attending: Ophthalmology

## 2014-10-30 ENCOUNTER — Ambulatory Visit: Payer: Medicare Other | Admitting: Anesthesiology

## 2014-10-30 ENCOUNTER — Encounter: Payer: Self-pay | Admitting: Ophthalmology

## 2014-10-30 DIAGNOSIS — M1990 Unspecified osteoarthritis, unspecified site: Secondary | ICD-10-CM | POA: Insufficient documentation

## 2014-10-30 DIAGNOSIS — Z9889 Other specified postprocedural states: Secondary | ICD-10-CM | POA: Insufficient documentation

## 2014-10-30 DIAGNOSIS — F329 Major depressive disorder, single episode, unspecified: Secondary | ICD-10-CM | POA: Insufficient documentation

## 2014-10-30 DIAGNOSIS — Z888 Allergy status to other drugs, medicaments and biological substances status: Secondary | ICD-10-CM | POA: Insufficient documentation

## 2014-10-30 DIAGNOSIS — Z9049 Acquired absence of other specified parts of digestive tract: Secondary | ICD-10-CM | POA: Insufficient documentation

## 2014-10-30 DIAGNOSIS — Z79899 Other long term (current) drug therapy: Secondary | ICD-10-CM | POA: Insufficient documentation

## 2014-10-30 DIAGNOSIS — Z885 Allergy status to narcotic agent status: Secondary | ICD-10-CM | POA: Insufficient documentation

## 2014-10-30 DIAGNOSIS — K219 Gastro-esophageal reflux disease without esophagitis: Secondary | ICD-10-CM | POA: Insufficient documentation

## 2014-10-30 DIAGNOSIS — Z8619 Personal history of other infectious and parasitic diseases: Secondary | ICD-10-CM | POA: Insufficient documentation

## 2014-10-30 DIAGNOSIS — H2511 Age-related nuclear cataract, right eye: Secondary | ICD-10-CM | POA: Insufficient documentation

## 2014-10-30 DIAGNOSIS — Z7982 Long term (current) use of aspirin: Secondary | ICD-10-CM | POA: Insufficient documentation

## 2014-10-30 DIAGNOSIS — J45909 Unspecified asthma, uncomplicated: Secondary | ICD-10-CM | POA: Insufficient documentation

## 2014-10-30 DIAGNOSIS — Z880 Allergy status to penicillin: Secondary | ICD-10-CM | POA: Insufficient documentation

## 2014-10-30 DIAGNOSIS — I252 Old myocardial infarction: Secondary | ICD-10-CM | POA: Insufficient documentation

## 2014-10-30 DIAGNOSIS — H9190 Unspecified hearing loss, unspecified ear: Secondary | ICD-10-CM | POA: Insufficient documentation

## 2014-10-30 DIAGNOSIS — Z87891 Personal history of nicotine dependence: Secondary | ICD-10-CM | POA: Insufficient documentation

## 2014-10-30 HISTORY — DX: Nausea with vomiting, unspecified: R11.2

## 2014-10-30 HISTORY — DX: Cardiac arrhythmia, unspecified: I49.9

## 2014-10-30 HISTORY — DX: Adverse effect of unspecified anesthetic, initial encounter: T41.45XA

## 2014-10-30 HISTORY — PX: CATARACT EXTRACTION W/PHACO: SHX586

## 2014-10-30 HISTORY — DX: Gastro-esophageal reflux disease without esophagitis: K21.9

## 2014-10-30 HISTORY — DX: Other specified postprocedural states: Z98.890

## 2014-10-30 HISTORY — DX: Unspecified asthma, uncomplicated: J45.909

## 2014-10-30 HISTORY — DX: Other complications of anesthesia, initial encounter: T88.59XA

## 2014-10-30 HISTORY — DX: Depression, unspecified: F32.A

## 2014-10-30 HISTORY — DX: Major depressive disorder, single episode, unspecified: F32.9

## 2014-10-30 HISTORY — DX: Unspecified hearing loss, unspecified ear: H91.90

## 2014-10-30 SURGERY — PHACOEMULSIFICATION, CATARACT, WITH IOL INSERTION
Anesthesia: Monitor Anesthesia Care | Site: Eye | Laterality: Right | Wound class: Clean

## 2014-10-30 MED ORDER — CEFUROXIME OPHTHALMIC INJECTION 1 MG/0.1 ML
INJECTION | OPHTHALMIC | Status: AC
  Filled 2014-10-30: qty 0.1

## 2014-10-30 MED ORDER — TETRACAINE HCL 0.5 % OP SOLN
OPHTHALMIC | Status: AC
  Administered 2014-10-30: 1 [drp] via OPHTHALMIC
  Filled 2014-10-30: qty 2

## 2014-10-30 MED ORDER — MIDAZOLAM HCL 2 MG/2ML IJ SOLN
INTRAMUSCULAR | Status: DC | PRN
  Administered 2014-10-30 (×2): 1 mg via INTRAVENOUS

## 2014-10-30 MED ORDER — NA CHONDROIT SULF-NA HYALURON 40-17 MG/ML IO SOLN
INTRAOCULAR | Status: AC
  Filled 2014-10-30: qty 1

## 2014-10-30 MED ORDER — TETRACAINE HCL 0.5 % OP SOLN
1.0000 [drp] | OPHTHALMIC | Status: AC | PRN
  Administered 2014-10-30: 1 [drp] via OPHTHALMIC

## 2014-10-30 MED ORDER — SODIUM CHLORIDE 0.9 % IV SOLN
INTRAVENOUS | Status: DC
Start: 2014-10-30 — End: 2014-10-30
  Administered 2014-10-30: 09:00:00 via INTRAVENOUS

## 2014-10-30 MED ORDER — CARBACHOL 0.01 % IO SOLN
INTRAOCULAR | Status: DC | PRN
Start: 2014-10-30 — End: 2014-10-30
  Administered 2014-10-30: 0.5 mL via INTRAOCULAR

## 2014-10-30 MED ORDER — EPINEPHRINE HCL 1 MG/ML IJ SOLN
INTRAMUSCULAR | Status: AC
  Filled 2014-10-30: qty 1

## 2014-10-30 MED ORDER — ARMC OPHTHALMIC DILATING GEL
OPHTHALMIC | Status: AC
  Administered 2014-10-30: 1 via OPHTHALMIC
  Filled 2014-10-30: qty 0.25

## 2014-10-30 MED ORDER — MOXIFLOXACIN HCL 0.5 % OP SOLN
OPHTHALMIC | Status: AC
  Filled 2014-10-30: qty 3

## 2014-10-30 MED ORDER — POVIDONE-IODINE 5 % OP SOLN
OPHTHALMIC | Status: AC
  Administered 2014-10-30: 1 via OPHTHALMIC
  Filled 2014-10-30: qty 30

## 2014-10-30 MED ORDER — EPINEPHRINE HCL 1 MG/ML IJ SOLN
INTRAOCULAR | Status: DC | PRN
  Administered 2014-10-30: 200 mL

## 2014-10-30 MED ORDER — MOXIFLOXACIN HCL 0.5 % OP SOLN - NO CHARGE
OPHTHALMIC | Status: DC | PRN
  Administered 2014-10-30: 1 [drp]

## 2014-10-30 MED ORDER — ARMC OPHTHALMIC DILATING GEL
1.0000 "application " | OPHTHALMIC | Status: DC | PRN
  Administered 2014-10-30: 1 via OPHTHALMIC

## 2014-10-30 MED ORDER — POVIDONE-IODINE 5 % OP SOLN
1.0000 "application " | OPHTHALMIC | Status: AC | PRN
  Administered 2014-10-30: 1 via OPHTHALMIC

## 2014-10-30 SURGICAL SUPPLY — 21 items
CANNULA ANT/CHMB 27G (MISCELLANEOUS) ×1 IMPLANT
CANNULA ANT/CHMB 27GA (MISCELLANEOUS) ×3 IMPLANT
GLOVE BIO SURGEON STRL SZ8 (GLOVE) ×3 IMPLANT
GLOVE BIOGEL M 6.5 STRL (GLOVE) ×3 IMPLANT
GLOVE SURG LX 8.0 MICRO (GLOVE) ×2
GLOVE SURG LX STRL 8.0 MICRO (GLOVE) ×1 IMPLANT
GOWN STRL REUS W/ TWL LRG LVL3 (GOWN DISPOSABLE) ×2 IMPLANT
GOWN STRL REUS W/TWL LRG LVL3 (GOWN DISPOSABLE) ×6
LENS IOL TECNIS 19.5 (Intraocular Lens) ×3 IMPLANT
LENS IOL TECNIS MONO 1P 19.5 (Intraocular Lens) IMPLANT
PACK CATARACT (MISCELLANEOUS) ×3 IMPLANT
PACK CATARACT BRASINGTON LX (MISCELLANEOUS) ×3 IMPLANT
PACK EYE AFTER SURG (MISCELLANEOUS) ×3 IMPLANT
SOL BSS BAG (MISCELLANEOUS) ×3
SOL PREP PVP 2OZ (MISCELLANEOUS) ×3
SOLUTION BSS BAG (MISCELLANEOUS) ×1 IMPLANT
SOLUTION PREP PVP 2OZ (MISCELLANEOUS) ×1 IMPLANT
SYR 5ML LL (SYRINGE) ×3 IMPLANT
SYR TB 1ML 27GX1/2 LL (SYRINGE) ×3 IMPLANT
WATER STERILE IRR 1000ML POUR (IV SOLUTION) ×3 IMPLANT
WIPE NON LINTING 3.25X3.25 (MISCELLANEOUS) ×3 IMPLANT

## 2014-10-30 NOTE — Transfer of Care (Signed)
Immediate Anesthesia Transfer of Care Note  Patient: Kayla Stout  Procedure(s) Performed: Procedure(s) with comments: CATARACT EXTRACTION PHACO AND INTRAOCULAR LENS PLACEMENT (IOC) (Right) - [place] 00:34 AP% 18.5 CDE 6.36  fluid pack lot# [id number]  Patient Location: PACU  Anesthesia Type:MAC  Level of Consciousness: awake, alert  and oriented  Airway & Oxygen Therapy: Patient Spontanous Breathing  Post-op Assessment: Report given to RN and Post -op Vital signs reviewed and stable  Post vital signs: stable  Last Vitals:  Filed Vitals:   10/30/14 1035  BP: 103/71  Pulse: 70  Temp: 36 C  Resp: 12    Complications: No apparent anesthesia complications

## 2014-10-30 NOTE — Discharge Instructions (Signed)
See cataract post op handout  °Cataract Surgery °Care After °Refer to this sheet in the next few weeks. These instructions provide you with information on caring for yourself after your procedure. Your caregiver may also give you more specific instructions. Your treatment has been planned according to current medical practices, but problems sometimes occur. Call your caregiver if you have any problems or questions after your procedure.  °HOME CARE INSTRUCTIONS  °· Avoid strenuous activities as directed by your caregiver. °· Ask your caregiver when you can resume driving. °· Use eyedrops or other medicines to help healing and control pressure inside your eye as directed by your caregiver. °· Only take over-the-counter or prescription medicines for pain, discomfort, or fever as directed by your caregiver. °· Do not to touch or rub your eyes. °· You may be instructed to use a protective shield during the first few days and nights after surgery. If not, wear sunglasses to protect your eyes. This is to protect the eye from pressure or from being accidentally bumped. °· Keep the area around your eye clean and dry. Avoid swimming or allowing water to hit you directly in the face while showering. Keep soap and shampoo out of your eyes. °· Do not bend or lift heavy objects. Bending increases pressure in the eye. You can walk, climb stairs, and do light household chores. °· Do not put a contact lens into the eye that had surgery until your caregiver says it is okay to do so. °· Ask your doctor when you can return to work. This will depend on the kind of work that you do. If you work in a dusty environment, you may be advised to wear protective eyewear for a period of time. °· Ask your caregiver when it will be safe to engage in sexual activity. °· Continue with your regular eye exams as directed by your caregiver. °What to expect: °· It is normal to feel itching and mild discomfort for a few days after cataract surgery. Some  fluid discharge is also common, and your eye may be sensitive to light and touch. °· After 1 to 2 days, even moderate discomfort should disappear. In most cases, healing will take about 6 weeks. °· If you received an intraocular lens (IOL), you may notice that colors are very bright or have a blue tinge. Also, if you have been in bright sunlight, everything may appear reddish for a few hours. If you see these color tinges, it is because your lens is clear and no longer cloudy. Within a few months after receiving an IOL, these extra colors should go away. When you have healed, you will probably need new glasses. °SEEK MEDICAL CARE IF:  °· You have increased bruising around your eye. °· You have discomfort not helped by medicine. °SEEK IMMEDIATE MEDICAL CARE IF:  °· You have a  fever. °· You have a worsening or sudden vision loss. °· You have redness, swelling, or increasing pain in the eye. °· You have a thick discharge from the eye that had surgery. °MAKE SURE YOU: °· Understand these instructions. °· Will watch your condition. °· Will get help right away if you are not doing well or get worse. °Document Released: 11/14/2004 Document Revised: 07/20/2011 Document Reviewed: 12/19/2010 °ExitCare® Patient Information ©2015 [employer]. This information is not intended to replace advice given to you by your health care provider. Make sure you discuss any questions you have with your health care provider. ° °

## 2014-10-30 NOTE — Op Note (Signed)
PREOPERATIVE DIAGNOSIS:  Nuclear sclerotic cataract of the right eye.   POSTOPERATIVE DIAGNOSIS:  nuclear sclerotic cataract right eye   OPERATIVE PROCEDURE: Procedure(s): CATARACT EXTRACTION PHACO AND INTRAOCULAR LENS PLACEMENT (IOC)   SURGEON:  Galen Manila, MD.   ANESTHESIA:  Anesthesiologist: Gijsbertus Georgana Curio, MD CRNA: Irving Burton, CRNA  1.      Managed anesthesia care. 2.      Topical tetracaine drops followed by 2% Xylocaine jelly applied in the preoperative holding area.   COMPLICATIONS:  None.   TECHNIQUE:   Stop and chop   DESCRIPTION OF PROCEDURE:  The patient was examined and consented in the preoperative holding area where the aforementioned topical anesthesia was applied to the right eye and then brought back to the Operating Room where the right eye was prepped and draped in the usual sterile ophthalmic fashion and a lid speculum was placed. A paracentesis was created with the side port blade and the anterior chamber was filled with viscoelastic. A near clear corneal incision was performed with the steel keratome. A continuous curvilinear capsulorrhexis was performed with a cystotome followed by the capsulorrhexis forceps. Hydrodissection and hydrodelineation were carried out with BSS on a blunt cannula. The lens was removed in a stop and chop  technique and the remaining cortical material was removed with the irrigation-aspiration handpiece. The capsular bag was inflated with viscoelastic and the Technis ZCB00  lens was placed in the capsular bag without complication. The remaining viscoelastic was removed from the eye with the irrigation-aspiration handpiece. The wounds were hydrated. The anterior chamber was flushed with Miostat and the eye was inflated to physiologic pressure. 0.2 mL of Vigamox diluted three/one with BSS was placed in the anterior chamber. The wounds were found to be water tight. The eye was dressed with Vigamox. The patient was given  protective glasses to wear throughout the day and a shield with which to sleep tonight. The patient was also given drops with which to begin a drop regimen today and will follow-up with me in one day.  Implant Name Type Inv. Item Serial No. Manufacturer Lot No. LRB No. Used  LENS IMPL INTRAOC [id number] 19.5 - [id number] Intraocular Lens LENS IMPL INTRAOC ZCB00 19.5 [id number] AMO   Right 1   Procedure(s) with comments: CATARACT EXTRACTION PHACO AND INTRAOCULAR LENS PLACEMENT (IOC) (Right) - [place] 00:34 AP% 18.5 CDE 6.36  fluid pack lot# [id number]  Electronically signed: Madason Rauls LOUIS 10/30/2014 10:31 AM

## 2014-10-30 NOTE — H&P (Signed)
  All labs reviewed. Abnormal studies sent to patients PCP when indicated.  Previous H&P reviewed, patient examined, there are NO CHANGES.  Kayla Stout LOUIS6/21/201610:05 AM

## 2014-10-30 NOTE — Anesthesia Preprocedure Evaluation (Signed)
Anesthesia Evaluation  Patient identified by MRN, date of birth, ID band Patient awake    Reviewed: Allergy & Precautions, NPO status   Airway Mallampati: III       Dental  (+) Teeth Intact   Pulmonary asthma , former smoker,    + decreased breath sounds      Cardiovascular hypertension, + Past MI and +CHF + dysrhythmias Rhythm:Regular     Neuro/Psych Anxiety negative neurological ROS     GI/Hepatic GERD-  ,(+) Hepatitis -, C  Endo/Other  Morbid obesity  Renal/GU negative Renal ROS  negative genitourinary   Musculoskeletal  (+) Arthritis -, Osteoarthritis,    Abdominal (+) + obese,   Peds negative pediatric ROS (+)  Hematology negative hematology ROS (+)   Anesthesia Other Findings   Reproductive/Obstetrics                             Anesthesia Physical Anesthesia Plan  ASA: III  Anesthesia Plan: MAC   Post-op Pain Management:    Induction: Intravenous  Airway Management Planned: Nasal Cannula  Additional Equipment:   Intra-op Plan:   Post-operative Plan:   Informed Consent: I have reviewed the patients History and Physical, chart, labs and discussed the procedure including the risks, benefits and alternatives for the proposed anesthesia with the patient or authorized representative who has indicated his/her understanding and acceptance.     Plan Discussed with: CRNA  Anesthesia Plan Comments:         Anesthesia Quick Evaluation

## 2014-10-30 NOTE — Anesthesia Postprocedure Evaluation (Signed)
  Anesthesia Post-op Note  Patient: Kayla Stout  Procedure(s) Performed: Procedure(s) with comments: CATARACT EXTRACTION PHACO AND INTRAOCULAR LENS PLACEMENT (IOC) (Right) - [place] 00:34 AP% 18.5 CDE 6.36  fluid pack lot# [id number]  Anesthesia type:MAC  Patient location: PACU  Post pain: Pain level controlled  Post assessment: Post-op Vital signs reviewed, Patient's Cardiovascular Status Stable, Respiratory Function Stable, Patent Airway and No signs of Nausea or vomiting  Post vital signs: Reviewed and stable  Last Vitals:  Filed Vitals:   10/30/14 1035  BP: 103/71  Pulse: 70  Temp: 36 C  Resp: 12    Level of consciousness: awake, alert  and patient cooperative  Complications: No apparent anesthesia complications

## 2014-11-01 MED FILL — Moxifloxacin HCl Ophth Soln 0.5% (Base Equiv): OPHTHALMIC | Qty: 3 | Status: AC

## 2014-11-09 ENCOUNTER — Encounter: Payer: Self-pay | Admitting: *Deleted

## 2014-11-09 NOTE — Pre-Procedure Instructions (Signed)
Dr. Druscilla BrowniePorfilio office notified of patient allergy to Lidocaine and Novacaine (spoke with Alvino ChapelEllen) . Alvino Chapelllen stated to proceed with Lidocaine and Tetracaine, they were used with previous eye surgery

## 2014-11-13 ENCOUNTER — Ambulatory Visit
Admission: RE | Admit: 2014-11-13 | Discharge: 2014-11-13 | Disposition: A | Discharge status: Home / Self Care | Payer: Medicare Other | Source: Ambulatory Visit | Attending: Ophthalmology | Admitting: Ophthalmology

## 2014-11-13 ENCOUNTER — Ambulatory Visit: Payer: Medicare Other | Admitting: Anesthesiology

## 2014-11-13 ENCOUNTER — Encounter: Payer: Self-pay | Admitting: *Deleted

## 2014-11-13 ENCOUNTER — Encounter
Admission: RE | Disposition: A | Discharge status: Home / Self Care | Payer: Self-pay | Source: Ambulatory Visit | Attending: Ophthalmology

## 2014-11-13 DIAGNOSIS — I10 Essential (primary) hypertension: Secondary | ICD-10-CM | POA: Insufficient documentation

## 2014-11-13 DIAGNOSIS — B1920 Unspecified viral hepatitis C without hepatic coma: Secondary | ICD-10-CM | POA: Diagnosis not present

## 2014-11-13 DIAGNOSIS — Z7982 Long term (current) use of aspirin: Secondary | ICD-10-CM | POA: Diagnosis not present

## 2014-11-13 DIAGNOSIS — I509 Heart failure, unspecified: Secondary | ICD-10-CM | POA: Diagnosis not present

## 2014-11-13 DIAGNOSIS — K219 Gastro-esophageal reflux disease without esophagitis: Secondary | ICD-10-CM | POA: Diagnosis not present

## 2014-11-13 DIAGNOSIS — M1990 Unspecified osteoarthritis, unspecified site: Secondary | ICD-10-CM | POA: Insufficient documentation

## 2014-11-13 DIAGNOSIS — Z888 Allergy status to other drugs, medicaments and biological substances status: Secondary | ICD-10-CM | POA: Insufficient documentation

## 2014-11-13 DIAGNOSIS — Z87891 Personal history of nicotine dependence: Secondary | ICD-10-CM | POA: Insufficient documentation

## 2014-11-13 DIAGNOSIS — H2511 Age-related nuclear cataract, right eye: Secondary | ICD-10-CM | POA: Diagnosis present

## 2014-11-13 DIAGNOSIS — J45909 Unspecified asthma, uncomplicated: Secondary | ICD-10-CM | POA: Diagnosis not present

## 2014-11-13 DIAGNOSIS — F419 Anxiety disorder, unspecified: Secondary | ICD-10-CM | POA: Insufficient documentation

## 2014-11-13 DIAGNOSIS — I252 Old myocardial infarction: Secondary | ICD-10-CM | POA: Diagnosis not present

## 2014-11-13 DIAGNOSIS — Z79899 Other long term (current) drug therapy: Secondary | ICD-10-CM | POA: Insufficient documentation

## 2014-11-13 DIAGNOSIS — H2512 Age-related nuclear cataract, left eye: Secondary | ICD-10-CM | POA: Insufficient documentation

## 2014-11-13 DIAGNOSIS — Z880 Allergy status to penicillin: Secondary | ICD-10-CM | POA: Diagnosis not present

## 2014-11-13 DIAGNOSIS — Z881 Allergy status to other antibiotic agents status: Secondary | ICD-10-CM | POA: Diagnosis not present

## 2014-11-13 HISTORY — DX: Bronchitis, not specified as acute or chronic: J40

## 2014-11-13 HISTORY — PX: CATARACT EXTRACTION W/PHACO: SHX586

## 2014-11-13 HISTORY — DX: Diverticulosis of intestine, part unspecified, without perforation or abscess without bleeding: K57.90

## 2014-11-13 HISTORY — DX: Other intervertebral disc displacement, lumbar region: M51.26

## 2014-11-13 SURGERY — PHACOEMULSIFICATION, CATARACT, WITH IOL INSERTION
Anesthesia: Monitor Anesthesia Care | Site: Eye | Laterality: Left | Wound class: Clean

## 2014-11-13 MED ORDER — ARMC OPHTHALMIC DILATING GEL
1.0000 "application " | OPHTHALMIC | Status: DC | PRN
  Administered 2014-11-13: 1 via OPHTHALMIC

## 2014-11-13 MED ORDER — EPINEPHRINE HCL 1 MG/ML IJ SOLN
INTRAOCULAR | Status: DC | PRN
  Administered 2014-11-13: 200 mL

## 2014-11-13 MED ORDER — SODIUM CHLORIDE 0.9 % IV SOLN
INTRAVENOUS | Status: DC
  Administered 2014-11-13: 11:00:00 via INTRAVENOUS

## 2014-11-13 MED ORDER — TETRACAINE HCL 0.5 % OP SOLN
1.0000 [drp] | OPHTHALMIC | Status: DC | PRN
  Administered 2014-11-13: 1 [drp] via OPHTHALMIC

## 2014-11-13 MED ORDER — MOXIFLOXACIN HCL 0.5 % OP SOLN: 1.0000 [drp] | OPHTHALMIC | Status: DC | PRN

## 2014-11-13 MED ORDER — POVIDONE-IODINE 5 % OP SOLN
1.0000 "application " | OPHTHALMIC | Status: DC | PRN
  Administered 2014-11-13: 1 via OPHTHALMIC

## 2014-11-13 MED ORDER — MIDAZOLAM HCL 2 MG/2ML IJ SOLN
INTRAMUSCULAR | Status: DC | PRN
  Administered 2014-11-13 (×2): 1 mg via INTRAVENOUS

## 2014-11-13 MED ORDER — EPINEPHRINE HCL 1 MG/ML IJ SOLN
INTRAMUSCULAR | Status: AC
  Filled 2014-11-13: qty 1

## 2014-11-13 MED ORDER — MOXIFLOXACIN HCL 0.5 % OP SOLN
OPHTHALMIC | Status: DC | PRN
  Administered 2014-11-13: 1 [drp] via OPHTHALMIC

## 2014-11-13 MED ORDER — TETRACAINE HCL 0.5 % OP SOLN
OPHTHALMIC | Status: AC
  Administered 2014-11-13: 1 [drp] via OPHTHALMIC
  Filled 2014-11-13: qty 2

## 2014-11-13 MED ORDER — ARMC OPHTHALMIC DILATING GEL
OPHTHALMIC | Status: AC
  Administered 2014-11-13: 1 via OPHTHALMIC
  Filled 2014-11-13: qty 0.25

## 2014-11-13 MED ORDER — MOXIFLOXACIN HCL 0.5 % OP SOLN
OPHTHALMIC | Status: AC
  Filled 2014-11-13: qty 3

## 2014-11-13 MED ORDER — NA CHONDROIT SULF-NA HYALURON 40-17 MG/ML IO SOLN
INTRAOCULAR | Status: AC
  Filled 2014-11-13: qty 1

## 2014-11-13 MED ORDER — POVIDONE-IODINE 5 % OP SOLN
OPHTHALMIC | Status: AC
  Administered 2014-11-13: 1 via OPHTHALMIC
  Filled 2014-11-13: qty 30

## 2014-11-13 MED ORDER — CARBACHOL 0.01 % IO SOLN
INTRAOCULAR | Status: DC | PRN
  Administered 2014-11-13: 0.5 mL via INTRAOCULAR

## 2014-11-13 MED ORDER — CEFUROXIME OPHTHALMIC INJECTION 1 MG/0.1 ML
INJECTION | OPHTHALMIC | Status: AC
  Filled 2014-11-13: qty 0.1

## 2014-11-13 SURGICAL SUPPLY — 23 items
CANNULA ANT/CHMB 27G (MISCELLANEOUS) ×1 IMPLANT
CANNULA ANT/CHMB 27GA (MISCELLANEOUS) ×3 IMPLANT
GLOVE BIO SURGEON STRL SZ8 (GLOVE) ×3 IMPLANT
GLOVE BIOGEL M 6.5 STRL (GLOVE) ×3 IMPLANT
GLOVE SURG LX 8.0 MICRO (GLOVE) ×2
GLOVE SURG LX STRL 8.0 MICRO (GLOVE) ×1 IMPLANT
GOWN STRL REUS W/ TWL LRG LVL3 (GOWN DISPOSABLE) ×2 IMPLANT
GOWN STRL REUS W/TWL LRG LVL3 (GOWN DISPOSABLE) ×6
LABEL OR SOLS (LABEL) ×2 IMPLANT
LENS IOL TECNIS 19.5 (Intraocular Lens) ×3 IMPLANT
LENS IOL TECNIS MONO 1P 19.5 (Intraocular Lens) IMPLANT
MARKER PEN SURG W/LABELS VIOL (MISCELLANEOUS) ×2 IMPLANT
PACK CATARACT (MISCELLANEOUS) ×3 IMPLANT
PACK CATARACT BRASINGTON LX (MISCELLANEOUS) ×3 IMPLANT
PACK EYE AFTER SURG (MISCELLANEOUS) ×3 IMPLANT
SOL BSS BAG (MISCELLANEOUS) ×3
SOL PREP PVP 2OZ (MISCELLANEOUS) ×3
SOLUTION BSS BAG (MISCELLANEOUS) ×1 IMPLANT
SOLUTION PREP PVP 2OZ (MISCELLANEOUS) ×1 IMPLANT
SYR 5ML LL (SYRINGE) ×3 IMPLANT
SYR TB 1ML 27GX1/2 LL (SYRINGE) ×3 IMPLANT
WATER STERILE IRR 1000ML POUR (IV SOLUTION) ×3 IMPLANT
WIPE NON LINTING 3.25X3.25 (MISCELLANEOUS) ×3 IMPLANT

## 2014-11-13 NOTE — Discharge Instructions (Signed)
FOLLOW EYE DROP INSTRUCTION SHEET AS REVIEWED Eye Surgery Discharge Instructions  Expect mild scratchy sensation or mild soreness. DO NOT RUB YOUR EYE!  The day of surgery:  Minimal physical activity, but bed rest is not required  No reading, computer work, or close hand work  No bending, lifting, or straining.  May watch TV  For 24 hours:  No driving, legal decisions, or alcoholic beverages  Safety precautions  Eat anything you prefer: It is better to start with liquids, then soup then solid foods.  _____ Eye patch should be worn until postoperative exam tomorrow.  ____ Solar shield eyeglasses should be worn for comfort in the sunlight/patch while sleeping  Resume all regular medications including aspirin or Coumadin if these were discontinued prior to surgery. You may shower, bathe, shave, or wash your hair. Tylenol may be taken for mild discomfort.  Call your doctor if you experience significant pain, nausea, or vomiting, fever > 101 or other signs of infection. [phone] or [phone] Specific instructions:  Follow-up Information    Follow up with Carlena BjornstadPORFILIO,WILLIAM LOUIS, MD On 11/14/2014.   Specialty:  Ophthalmology   Why:  9:05 am   Contact information:   [address] [place] [address] [phone]

## 2014-11-13 NOTE — H&P (Signed)
  All labs reviewed. Abnormal studies sent to patients PCP when indicated.  Previous H&P reviewed, patient examined, there are NO CHANGES.  Janie Strothman LOUIS7/5/201611:41 AM

## 2014-11-13 NOTE — Anesthesia Postprocedure Evaluation (Signed)
  Anesthesia Post-op Note  Patient: Kayla Stout  Procedure(s) Performed: Procedure(s) with comments: CATARACT EXTRACTION PHACO AND INTRAOCULAR LENS PLACEMENT (IOC) (Left) - US 01:04 AP% 11.4 CDE 7.39 fluid pack lot # [id number]  Anesthesia type:MAC  Patient location: PACU  Post pain: Pain level controlled  Post assessment: Post-op Vital signs reviewed, Patient's Cardiovascular Status Stable, Respiratory Function Stable, Patent Airway and No signs of Nausea or vomiting  Post vital signs: Reviewed and stable  Last Vitals:  Filed Vitals:   11/13/14 1223  BP: 124/73  Pulse:   Temp:   Resp:     Level of consciousness: awake, alert  and patient cooperative  Complications: No apparent anesthesia complications

## 2014-11-13 NOTE — Transfer of Care (Signed)
Immediate Anesthesia Transfer of Care Note  Patient: Kayla Stout  Procedure(s) Performed: Procedure(s) with comments: CATARACT EXTRACTION PHACO AND INTRAOCULAR LENS PLACEMENT (IOC) (Left) - US 01:04 AP% 11.4 CDE 7.39 fluid pack lot # [id number]  Patient Location: PACU  Anesthesia Type:MAC  Level of Consciousness: awake, alert , oriented and patient cooperative  Airway & Oxygen Therapy: Patient Spontanous Breathing  Post-op Assessment: Report given to RN and Post -op Vital signs reviewed and stable  Post vital signs: Reviewed and stable  Last Vitals:  Filed Vitals:   11/13/14 1210  BP: 114/93  Pulse:   Temp: 37 C  Resp: 18    Complications: No apparent anesthesia complications

## 2014-11-13 NOTE — Op Note (Signed)
PREOPERATIVE DIAGNOSIS:  Nuclear sclerotic cataract of the right eye.   POSTOPERATIVE DIAGNOSIS:  nuclear sclerotic cataract left eye   OPERATIVE PROCEDURE: Procedure(s): CATARACT EXTRACTION PHACO AND INTRAOCULAR LENS PLACEMENT (IOC)   SURGEON:  Galen ManilaWilliam Lissett Favorite, MD.   ANESTHESIA:  Anesthesiologist: Rosaria FerriesJoseph K Piscitello, MD CRNA: Darrol Jumpavid Marion, CRNA  1.      Managed anesthesia care. 2.      Topical tetracaine drops followed by 2% Xylocaine jelly applied in the preoperative holding area.   COMPLICATIONS:  None.   TECHNIQUE:   Stop and chop   DESCRIPTION OF PROCEDURE:  The patient was examined and consented in the preoperative holding area where the aforementioned topical anesthesia was applied to the right eye and then brought back to the Operating Room where the right eye was prepped and draped in the usual sterile ophthalmic fashion and a lid speculum was placed. A paracentesis was created with the side port blade and the anterior chamber was filled with viscoelastic. A near clear corneal incision was performed with the steel keratome. A continuous curvilinear capsulorrhexis was performed with a cystotome followed by the capsulorrhexis forceps. Hydrodissection and hydrodelineation were carried out with BSS on a blunt cannula. The lens was removed in a stop and chop  technique and the remaining cortical material was removed with the irrigation-aspiration handpiece. The capsular bag was inflated with viscoelastic and the Technis ZCB00  lens was placed in the capsular bag without complication. The remaining viscoelastic was removed from the eye with the irrigation-aspiration handpiece. The wounds were hydrated. The anterior chamber was flushed with Miostat and the eye was inflated to physiologic pressure. 0.2 mL of Vigamox diluted three/one with BSS was placed in the anterior chamber. The wounds were found to be water tight. The eye was dressed with Vigamox. The patient was given protective glasses  to wear throughout the day and a shield with which to sleep tonight. The patient was also given drops with which to begin a drop regimen today and will follow-up with me in one day.  Implant Name Type Inv. Item Serial No. Manufacturer Lot No. LRB No. Used  LENS IMPL INTRAOC ZCB00 19.5 - [id number] Intraocular Lens LENS IMPL INTRAOC ZCB00 19.5 [id number] AMO   Left 1   Procedure(s) with comments: CATARACT EXTRACTION PHACO AND INTRAOCULAR LENS PLACEMENT (IOC) (Left) - US 01:04 AP% 11.4 CDE 7.39 fluid pack lot # [id number]  Electronically signed: Birney Belshe LOUIS 11/13/2014 12:08 PM

## 2014-11-13 NOTE — Anesthesia Preprocedure Evaluation (Signed)
Anesthesia Evaluation  Patient identified by MRN, date of birth, ID band Patient awake    Reviewed: Allergy & Precautions, H&P , NPO status , Patient's Chart, lab work & pertinent test results  History of Anesthesia Complications (+) PONV and history of anesthetic complications  Airway Mallampati: III       Dental  (+) Teeth Intact   Pulmonary asthma , former smoker,    + decreased breath sounds      Cardiovascular hypertension, + Past MI and +CHF + dysrhythmias Rhythm:Regular     Neuro/Psych Anxiety negative neurological ROS     GI/Hepatic GERD-  ,(+) Hepatitis -, C  Endo/Other  Morbid obesity  Renal/GU negative Renal ROS  negative genitourinary   Musculoskeletal  (+) Arthritis -, Osteoarthritis,    Abdominal (+) + obese,   Peds negative pediatric ROS (+)  Hematology negative hematology ROS (+)   Anesthesia Other Findings   Reproductive/Obstetrics negative OB ROS                             Anesthesia Physical Anesthesia Plan  ASA: III  Anesthesia Plan: MAC   Post-op Pain Management:    Induction:   Airway Management Planned:   Additional Equipment:   Intra-op Plan:   Post-operative Plan:   Informed Consent: I have reviewed the patients History and Physical, chart, labs and discussed the procedure including the risks, benefits and alternatives for the proposed anesthesia with the patient or authorized representative who has indicated his/her understanding and acceptance.   Dental Advisory Given  Plan Discussed with: Anesthesiologist, CRNA and Surgeon  Anesthesia Plan Comments:         Anesthesia Quick Evaluation

## 2015-11-13 ENCOUNTER — Other Ambulatory Visit: Payer: Self-pay | Admitting: Internal Medicine

## 2015-11-13 DIAGNOSIS — Z1231 Encounter for screening mammogram for malignant neoplasm of breast: Secondary | ICD-10-CM

## 2015-11-14 ENCOUNTER — Other Ambulatory Visit: Payer: Self-pay | Admitting: Internal Medicine

## 2015-11-14 ENCOUNTER — Ambulatory Visit
Admission: RE | Admit: 2015-11-14 | Discharge: 2015-11-14 | Disposition: A | Discharge status: Home / Self Care | Payer: Medicare Other | Source: Ambulatory Visit | Attending: Internal Medicine | Admitting: Internal Medicine

## 2015-11-14 DIAGNOSIS — Z1231 Encounter for screening mammogram for malignant neoplasm of breast: Secondary | ICD-10-CM | POA: Diagnosis not present

## 2016-10-12 ENCOUNTER — Other Ambulatory Visit: Payer: Self-pay | Admitting: Internal Medicine

## 2016-10-12 DIAGNOSIS — Z1231 Encounter for screening mammogram for malignant neoplasm of breast: Secondary | ICD-10-CM

## 2016-12-08 ENCOUNTER — Ambulatory Visit
Admission: RE | Admit: 2016-12-08 | Discharge: 2016-12-08 | Disposition: A | Discharge status: Home / Self Care | Payer: Medicare Other | Source: Ambulatory Visit | Attending: Internal Medicine | Admitting: Internal Medicine

## 2016-12-08 DIAGNOSIS — Z1231 Encounter for screening mammogram for malignant neoplasm of breast: Secondary | ICD-10-CM | POA: Diagnosis present

## 2016-12-15 ENCOUNTER — Ambulatory Visit: Payer: Medicare Other

## 2017-03-26 ENCOUNTER — Other Ambulatory Visit: Payer: Self-pay | Admitting: Gastroenterology

## 2017-03-26 DIAGNOSIS — K760 Fatty (change of) liver, not elsewhere classified: Secondary | ICD-10-CM

## 2017-05-11 ENCOUNTER — Emergency Department: Payer: Medicare Other

## 2017-05-11 ENCOUNTER — Other Ambulatory Visit: Payer: Self-pay

## 2017-05-11 ENCOUNTER — Emergency Department
Admission: EM | Admit: 2017-05-11 | Discharge: 2017-05-11 | Disposition: A | Discharge status: Home / Self Care | Payer: Medicare Other | Attending: Emergency Medicine | Admitting: Emergency Medicine

## 2017-05-11 ENCOUNTER — Encounter: Payer: Self-pay | Admitting: Emergency Medicine

## 2017-05-11 DIAGNOSIS — J45909 Unspecified asthma, uncomplicated: Secondary | ICD-10-CM | POA: Insufficient documentation

## 2017-05-11 DIAGNOSIS — Z7982 Long term (current) use of aspirin: Secondary | ICD-10-CM | POA: Insufficient documentation

## 2017-05-11 DIAGNOSIS — I252 Old myocardial infarction: Secondary | ICD-10-CM | POA: Diagnosis not present

## 2017-05-11 DIAGNOSIS — I10 Essential (primary) hypertension: Secondary | ICD-10-CM | POA: Insufficient documentation

## 2017-05-11 DIAGNOSIS — J209 Acute bronchitis, unspecified: Secondary | ICD-10-CM | POA: Insufficient documentation

## 2017-05-11 DIAGNOSIS — Z87891 Personal history of nicotine dependence: Secondary | ICD-10-CM | POA: Diagnosis not present

## 2017-05-11 DIAGNOSIS — R05 Cough: Secondary | ICD-10-CM | POA: Diagnosis present

## 2017-05-11 LAB — CBC WITH DIFFERENTIAL/PLATELET
Basophils Absolute: 0 10*3/uL (ref 0–0.1)
Basophils Relative: 0 %
Eosinophils Absolute: 0 10*3/uL (ref 0–0.7)
Eosinophils Relative: 0 %
HCT: 45.2 % (ref 35.0–47.0)
Hemoglobin: 15.1 g/dL (ref 12.0–16.0)
Lymphocytes Relative: 11 %
Lymphs Abs: 1.5 10*3/uL (ref 1.0–3.6)
MCH: 32.5 pg (ref 26.0–34.0)
MCHC: 33.4 g/dL (ref 32.0–36.0)
MCV: 97.1 fL (ref 80.0–100.0)
Monocytes Absolute: 0.5 10*3/uL (ref 0.2–0.9)
Monocytes Relative: 4 %
Neutro Abs: 11.7 10*3/uL — ABNORMAL HIGH (ref 1.4–6.5)
Neutrophils Relative %: 85 %
Platelets: 338 10*3/uL (ref 150–440)
RBC: 4.66 MIL/uL (ref 3.80–5.20)
RDW: 14.7 % — ABNORMAL HIGH (ref 11.5–14.5)
WBC: 13.8 10*3/uL — ABNORMAL HIGH (ref 3.6–11.0)

## 2017-05-11 LAB — COMPREHENSIVE METABOLIC PANEL
ALT: 50 U/L (ref 14–54)
ANION GAP: 10 (ref 5–15)
AST: 30 U/L (ref 15–41)
Albumin: 3.6 g/dL (ref 3.5–5.0)
Alkaline Phosphatase: 83 U/L (ref 38–126)
BUN: 22 mg/dL — ABNORMAL HIGH (ref 6–20)
CALCIUM: 9.2 mg/dL (ref 8.9–10.3)
CO2: 28 mmol/L (ref 22–32)
Chloride: 101 mmol/L (ref 101–111)
Creatinine, Ser: 0.91 mg/dL (ref 0.44–1.00)
GFR calc non Af Amer: >60 mL/min (ref >60)
Glucose, Bld: 109 mg/dL — ABNORMAL HIGH (ref 65–99)
POTASSIUM: 4.5 mmol/L (ref 3.5–5.1)
SODIUM: 139 mmol/L (ref 135–145)
Total Bilirubin: 1.1 mg/dL (ref 0.3–1.2)
Total Protein: 7.3 g/dL (ref 6.5–8.1)

## 2017-05-11 LAB — TROPONIN I: Troponin I: <0.03 ng/mL

## 2017-05-11 LAB — LACTIC ACID, PLASMA: Lactic Acid, Venous: 2 mmol/L (ref 0.5–1.9)

## 2017-05-11 MED ORDER — PSEUDOEPH-BROMPHEN-DM 30-2-10 MG/5ML PO SYRP: 5.0000 mL | ORAL_SOLUTION | Freq: Four times a day (QID) | ORAL | 0 refills | Status: DC | PRN

## 2017-05-11 MED ORDER — IPRATROPIUM-ALBUTEROL 0.5-2.5 (3) MG/3ML IN SOLN
3.0000 mL | Freq: Once | RESPIRATORY_TRACT | Status: AC
  Administered 2017-05-11: 3 mL via RESPIRATORY_TRACT
  Filled 2017-05-11: qty 3

## 2017-05-11 MED ORDER — OSELTAMIVIR PHOSPHATE 75 MG PO CAPS: 75.0000 mg | ORAL_CAPSULE | Freq: Two times a day (BID) | ORAL | 0 refills | Status: DC

## 2017-05-11 MED ORDER — ALBUTEROL SULFATE (2.5 MG/3ML) 0.083% IN NEBU: 2.5000 mg | INHALATION_SOLUTION | Freq: Four times a day (QID) | RESPIRATORY_TRACT | 12 refills | Status: DC | PRN

## 2017-05-11 MED ORDER — IOPAMIDOL (ISOVUE-300) INJECTION 61%
75.0000 mL | Freq: Once | INTRAVENOUS | Status: AC | PRN
  Administered 2017-05-11: 75 mL via INTRAVENOUS
  Filled 2017-05-11: qty 75

## 2017-05-11 NOTE — ED Notes (Signed)
See triage note  States she developed cough and some SOB last week  Placed a call to her PCP and he placed her on prednisone and ceftin  But this am states she feels like she is not moving any air

## 2017-05-11 NOTE — ED Triage Notes (Signed)
Cough x 1 week. Speaking full sentences. States has taken prednisone without improvement.

## 2017-05-11 NOTE — Discharge Instructions (Addendum)
Follow-up with Dr. Hyacinth MeekerMiller if any continued problems. Keep your appointment this month with him. Call the office if any continued problems. U also given a prescription for a nebulizer machine along with albuterol nebulizer solution to use every 4-6 hours as needed for cough or wheezing. Continue your antibiotics until finished.

## 2017-05-11 NOTE — ED Provider Notes (Signed)
[facility] Emergency Department Provider Note  ____________________________________________   First MD Initiated Contact with Patient 05/11/17 1051     (approximate)  I have reviewed the triage vital signs and the nursing notes.   HISTORY  Chief Complaint Cough   HPI Kayla Stout is a 67 y.o. female is here complaining of cough for 1 week. Patient states that she called Dr. Rondel Baton office and he called in a prescription for an antibiotic and prednisone. Patient states that she has not seen any improvement. She denies any fever or chills. She states that she is tired and has been wheezing. She is also requesting a breathing treatment as she has not used her inhaler today. Patient denies any vomiting or diarrhea. She denies muscle aches.   Past Medical History:  Diagnosis Date  . Acute MI (HCC) 6 years ago  . Anxiety   . Arthritis   . Asthma   . Bronchitis   . Chronic back pain   . Complication of anesthesia    Rash and breathing difficulties r/t some local anesthetic  . Costal chondritis   . Depression   . Diverticulosis   . Dysrhythmia   . GERD (gastroesophageal reflux disease)    history of ulcers  . H/O: GI bleed   . Hepatitis C   . History of kidney stones   . HOH (hard of hearing)   . Hx of ectopic pregnancy   . Hypertension   . Lumbar herniated disc   . Pneumonia   . PONV (postoperative nausea and vomiting)     Patient Active Problem List   Diagnosis Date Noted  . Acute systolic congestive heart failure (HCC) 04/07/2011  . Myocardial infarction (HCC) 04/03/2011  . Obesity 03/31/2011  . Hepatitis C 03/31/2011  . Anxiety 03/31/2011  . Acute bronchitis 03/31/2011  . Cocaine abuse in remission (HCC) 03/31/2011  . Tobacco use disorder 03/31/2011  . Leukocytosis 03/31/2011  . Hyponatremia 03/31/2011    Past Surgical History:  Procedure Laterality Date  . ABDOMINAL HYSTERECTOMY  1995  . CARDIAC CATHETERIZATION  2006  .  CATARACT EXTRACTION W/PHACO Right 10/30/2014   Procedure: CATARACT EXTRACTION PHACO AND INTRAOCULAR LENS PLACEMENT (IOC);  Surgeon: Galen Manila, MD;  Location: [facility] ORS;  Service: Ophthalmology;  Laterality: Right;  [place] 00:34 AP% 18.5 CDE 6.36  fluid pack lot# [id number]  . CATARACT EXTRACTION W/PHACO Left 11/13/2014   Procedure: CATARACT EXTRACTION PHACO AND INTRAOCULAR LENS PLACEMENT (IOC);  Surgeon: Galen Manila, MD;  Location: [facility] ORS;  Service: Ophthalmology;  Laterality: Left;  [place] 01:04 AP% 11.4 CDE 7.39 fluid pack lot # [id number]  . CHOLECYSTECTOMY  1980  . CYSTOSCOPY W/ LITHOLAPAXY / EHL  1992  . ECTOPIC PREGNANCY SURGERY  1982  . TUMOR EXCISION  1995   vocal cords    Prior to Admission medications   Medication Sig Start Date End Date Taking? Authorizing Provider  albuterol (PROVENTIL) (2.5 MG/3ML) 0.083% nebulizer solution Take 3 mLs (2.5 mg total) by nebulization every 6 (six) hours as needed for wheezing or shortness of breath. 05/11/17   Tommi Rumps, PA-C  ALPRAZolam Prudy Feeler) 1 MG tablet Take 1 mg by mouth at bedtime as needed. For sleep     [provider]  aspirin 81 MG tablet Take 162 mg by mouth daily. 2 81 mg aspirin    [provider]  B Complex-C (B-COMPLEX WITH VITAMIN C) tablet Take 1 tablet by mouth daily.    [provider]  cetirizine (ZYRTEC) 10 MG tablet Take 10 mg by mouth daily.    [provider]  Ipratropium-Albuterol (COMBIVENT IN) Inhale into the lungs.    [provider]  lisinopril (PRINIVIL,ZESTRIL) 5 MG tablet Take 5 mg by mouth daily. hs    [provider]  metoprolol (TOPROL-XL) 50 MG 24 hr tablet Take 50 mg by mouth at bedtime.     [provider]  Travoprost, BAK Free, (TRAVATAN) 0.004 % SOLN ophthalmic solution Place 1 drop into both eyes at bedtime.     [provider]    Allergies Bee pollen; Codeine; Peanut-containing drug products; Lidocaine; Novocain [procaine];  and Penicillins  No family history on file.  Social History Social History   Tobacco Use  . Smoking status: Former Smoker    Packs/day: 0.25    Years: 20.00    Pack years: 5.00    Types: Cigarettes  . Smokeless tobacco: Never Used  Substance Use Topics  . Alcohol use: No  . Drug use: No    Comment: History of caocaine abuse    Review of Systems Constitutional: No fever/chills ENT: No sore throat. Cardiovascular: Denies chest pain. Respiratory: positive shortness of breath.  Positive cough and wheezing. Gastrointestinal: No abdominal pain.  No nausea, no vomiting.  No diarrhea.  Musculoskeletal: Negative for back pain. Skin: Negative for rash. Neurological: Negative for headaches ____________________________________________   PHYSICAL EXAM:  VITAL SIGNS: ED Triage Vitals  Enc Vitals Group     BP 05/11/17 1027 115/79     Pulse Rate 05/11/17 1027 72     Resp 05/11/17 1027 20     Temp 05/11/17 1027 97.8 F (36.6 C)     Temp Source 05/11/17 1027 Oral     SpO2 05/11/17 1027 94 %     Weight 05/11/17 1028 272 lb (123.4 kg)     Height 05/11/17 1028 5\' 1"  (1.549 m)     Head Circumference --      Peak Flow --      Pain Score 05/11/17 1026 4     Pain Loc --      Pain Edu? --      Excl. in GC? --    Constitutional: Alert and oriented. Well appearing and in no acute distress. Eyes: Conjunctivae are normal.  Head: Atraumatic. Nose: mild congestion/rhinnorhea.  TMs are dull bilaterally. EAC is partially occluded with cerumen. Mouth/Throat: Mucous membranes are moist.  Oropharynx non-erythematous. Neck: No stridor.   Hematological/Lymphatic/Immunilogical: No cervical lymphadenopathy. Cardiovascular: Normal rate, regular rhythm. Grossly normal heart sounds.  Good peripheral circulation. Respiratory: Normal respiratory effort.  No retractions. Lungs x-rays wheezes are heard throughout. Patient does not appear to be in acute distress and is able to talk in complete sentences  without difficulty. She also has a congested cough that is nonproductive. Musculoskeletal: moves upper and lower extremities and difficulty. Normal gait was noted. Neurologic:  Normal speech and language. No gross focal neurologic deficits are appreciated.  Skin:  Skin is warm, dry and intact. No rash noted. Psychiatric: Mood and affect are normal. Speech and behavior are normal.  ____________________________________________   LABS (all labs ordered are listed, but only abnormal results are displayed)  Labs Reviewed  CBC WITH DIFFERENTIAL/PLATELET - Abnormal; Notable for the following components:      Result Value   WBC 13.8 (*)    RDW 14.7 (*)    Neutro Abs 11.7 (*)    All other components within normal limits  COMPREHENSIVE METABOLIC PANEL -  Abnormal; Notable for the following components:   Glucose, Bld 109 (*)    BUN 22 (*)    All other components within normal limits  LACTIC ACID, PLASMA - Abnormal; Notable for the following components:   Lactic Acid, Venous 2.0 (*)    All other components within normal limits  TROPONIN I  LACTIC ACID, PLASMA   ____________________________________________  EKG Reviewed by Dr. Lenard Lance NSR with sinus arrhythmia. Ventricular rate is 70. RBBB  ____________________________________________  RADIOLOGY  Dg Chest 2 View  Result Date: 05/11/2017 CLINICAL DATA:  Difficulty breathing. EXAM: CHEST  2 VIEW COMPARISON:  04/05/2011 FINDINGS: Cardiopericardial silhouette is at upper limits of normal for size. Interstitial markings are diffusely coarsened with chronic features. Bibasilar atelectasis or scarring noted. Approximately 2 x 3 cm nodular density projects over the left lung base. No overt edema or pleural effusion. The visualized bony structures of the thorax are intact. IMPRESSION: 2 x 3 cm nodular density at the left lung base. This could be atelectasis or pneumonia but neoplasm is also possibility. CT imaging of the chest recommended to  further evaluate. Electronically Signed   By: Kennith Center M.D.   On: 05/11/2017 11:04   Ct Chest W Contrast  Result Date: 05/11/2017 CLINICAL DATA:  Shortness of breath and intermittent fever EXAM: CT CHEST WITH CONTRAST TECHNIQUE: Multidetector CT imaging of the chest was performed during intravenous contrast administration. CONTRAST:  75mL ISOVUE-300 IOPAMIDOL (ISOVUE-300) INJECTION 61% COMPARISON:  Chest x-ray earlier today. FINDINGS: Cardiovascular: Heart size is normal. 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 There is abdominal aortic atherosclerosis without aneurysm. Mediastinum/Nodes: No mediastinal lymphadenopathy. There is no hilar lymphadenopathy. The esophagus has normal imaging features. There is no axillary lymphadenopathy. Lungs/Pleura: 10 mm ground-glass nodule identified medial right apex on image 21. Right middle lobe atelectasis or scarring is new in the interval. 9 mm central right upper lobe ground-glass nodule is seen on image 50. Small focus of atelectasis or scarring is identified in the anterior left lower lobe towards the base and accounts for the abnormality seen on the chest x-ray earlier today. No pulmonary edema or pleural effusion. Upper Abdomen: Question fine subtle nodularity of the hepatic contour with asymmetric enlargement of the lateral segment left liver and caudate lobe. 12 mm exophytic lesion upper pole left kidney is compatible with a cyst. Musculoskeletal: Bone windows reveal no worrisome lytic or sclerotic osseous lesions. IMPRESSION: 1. 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 2. Area of atelectasis or scarring in the anterior left lower lobe accounts for the opacity seen on the chest x-ray earlier today. 3. Ground-glass nodules in the right upper lobe measure up to 10 mm. Initial follow-up with CT at 6-12 months is recommended to confirm persistence. If persistent, repeat CT is  recommended every 2 years until 5 years of stability has been established. This recommendation follows the consensus statement: Guidelines for Management of Incidental Pulmonary Nodules Detected on CT Images: From the Fleischner Society 2017; Radiology 2017; 284:228-243. 4. Medial right upper lobe atelectasis. Attention on follow-up recommended. 5. Question subtle nodularity of hepatic contour with enlargement of the left liver and caudate lobe. While not definite, imaging features raise the question of cirrhosis. Electronically Signed   By: Kennith Center M.D.   On: 05/11/2017 13:50    ____________________________________________   PROCEDURES  Procedure(s) performed: None  Procedures  Critical Care performed: No  ____________________________________________   INITIAL IMPRESSION / ASSESSMENT AND PLAN / ED COURSE ----------------------------------------- 2:17 PM  on 05/11/2017 ----------------------------------------- Patient announced that she is ready to go home and declined any further medication. She did stay for a nebulizer treatment. He request a nebulizer machine and solution to use at home. She will continue taking the antibiotic that was prescribed to her by her PCP and finish her prednisone. She intends to call him tomorrow for further instructions.Patient declined IV antibiotics. Patient was discharged in stable condition and was ambulatory in the hallway for bathroom purposes.   ____________________________________________   FINAL CLINICAL IMPRESSION(S) / ED DIAGNOSES  Final diagnoses:  Acute bronchitis, unspecified organism     ED Discharge Orders        Ordered    oseltamivir (TAMIFLU) 75 MG capsule  2 times daily,   Status:  Discontinued     05/11/17 1136    brompheniramine-pseudoephedrine-DM 30-2-10 MG/5ML syrup  4 times daily PRN,   Status:  Discontinued     05/11/17 1136    DME Nebulizer machine     05/11/17 1404    albuterol (PROVENTIL) (2.5 MG/3ML) 0.083%  nebulizer solution  Every 6 hours PRN     05/11/17 1404       Note:  This document was prepared using Dragon voice recognition software and may include unintentional dictation errors.    Tommi Rumps, PA-C 05/11/17 1648    Minna Antis, MD 05/12/17 2158

## 2017-05-18 ENCOUNTER — Ambulatory Visit
Admission: RE | Admit: 2017-05-18 | Discharge: 2017-05-18 | Disposition: A | Discharge status: Home / Self Care | Payer: Medicare Other | Source: Ambulatory Visit | Attending: Gastroenterology | Admitting: Gastroenterology

## 2017-05-18 DIAGNOSIS — Z9049 Acquired absence of other specified parts of digestive tract: Secondary | ICD-10-CM | POA: Diagnosis not present

## 2017-05-18 DIAGNOSIS — K760 Fatty (change of) liver, not elsewhere classified: Secondary | ICD-10-CM | POA: Insufficient documentation

## 2017-05-19 ENCOUNTER — Other Ambulatory Visit: Payer: Self-pay | Admitting: Gastroenterology

## 2017-05-19 DIAGNOSIS — K760 Fatty (change of) liver, not elsewhere classified: Secondary | ICD-10-CM

## 2017-11-15 ENCOUNTER — Ambulatory Visit: Payer: Medicare Other

## 2017-12-02 ENCOUNTER — Other Ambulatory Visit: Payer: Self-pay | Admitting: Internal Medicine

## 2017-12-02 DIAGNOSIS — Z1231 Encounter for screening mammogram for malignant neoplasm of breast: Secondary | ICD-10-CM

## 2017-12-28 ENCOUNTER — Ambulatory Visit
Admission: RE | Admit: 2017-12-28 | Discharge: 2017-12-28 | Disposition: A | Discharge status: Home / Self Care | Payer: Medicare Other | Source: Ambulatory Visit | Attending: Internal Medicine | Admitting: Internal Medicine

## 2017-12-28 ENCOUNTER — Other Ambulatory Visit: Payer: Self-pay | Admitting: Gastroenterology

## 2017-12-28 DIAGNOSIS — K7460 Unspecified cirrhosis of liver: Secondary | ICD-10-CM

## 2017-12-28 DIAGNOSIS — Z1231 Encounter for screening mammogram for malignant neoplasm of breast: Secondary | ICD-10-CM | POA: Diagnosis present

## 2017-12-31 ENCOUNTER — Ambulatory Visit
Admission: RE | Admit: 2017-12-31 | Discharge: 2017-12-31 | Disposition: A | Discharge status: Home / Self Care | Payer: Medicare Other | Source: Ambulatory Visit | Attending: Gastroenterology | Admitting: Gastroenterology

## 2017-12-31 DIAGNOSIS — K7460 Unspecified cirrhosis of liver: Secondary | ICD-10-CM | POA: Diagnosis not present

## 2017-12-31 DIAGNOSIS — Z9049 Acquired absence of other specified parts of digestive tract: Secondary | ICD-10-CM | POA: Diagnosis not present

## 2018-02-16 ENCOUNTER — Encounter: Payer: Self-pay | Admitting: *Deleted

## 2018-02-17 ENCOUNTER — Encounter: Payer: Self-pay | Admitting: Anesthesiology

## 2018-02-17 ENCOUNTER — Ambulatory Visit
Admission: RE | Admit: 2018-02-17 | Discharge: 2018-02-17 | Disposition: A | Discharge status: Home / Self Care | Payer: Medicare Other | Source: Ambulatory Visit | Attending: Gastroenterology | Admitting: Gastroenterology

## 2018-02-17 ENCOUNTER — Ambulatory Visit: Payer: Medicare Other | Admitting: Anesthesiology

## 2018-02-17 ENCOUNTER — Encounter
Admission: RE | Disposition: A | Discharge status: Home / Self Care | Payer: Self-pay | Source: Ambulatory Visit | Attending: Gastroenterology

## 2018-02-17 DIAGNOSIS — Z79899 Other long term (current) drug therapy: Secondary | ICD-10-CM | POA: Diagnosis not present

## 2018-02-17 DIAGNOSIS — I252 Old myocardial infarction: Secondary | ICD-10-CM | POA: Diagnosis not present

## 2018-02-17 DIAGNOSIS — Z87891 Personal history of nicotine dependence: Secondary | ICD-10-CM | POA: Insufficient documentation

## 2018-02-17 DIAGNOSIS — F419 Anxiety disorder, unspecified: Secondary | ICD-10-CM | POA: Diagnosis not present

## 2018-02-17 DIAGNOSIS — Z8711 Personal history of peptic ulcer disease: Secondary | ICD-10-CM | POA: Insufficient documentation

## 2018-02-17 DIAGNOSIS — K259 Gastric ulcer, unspecified as acute or chronic, without hemorrhage or perforation: Secondary | ICD-10-CM | POA: Diagnosis not present

## 2018-02-17 DIAGNOSIS — K2950 Unspecified chronic gastritis without bleeding: Secondary | ICD-10-CM | POA: Diagnosis not present

## 2018-02-17 DIAGNOSIS — K3189 Other diseases of stomach and duodenum: Secondary | ICD-10-CM | POA: Insufficient documentation

## 2018-02-17 DIAGNOSIS — F329 Major depressive disorder, single episode, unspecified: Secondary | ICD-10-CM | POA: Diagnosis not present

## 2018-02-17 DIAGNOSIS — K2210 Ulcer of esophagus without bleeding: Secondary | ICD-10-CM | POA: Insufficient documentation

## 2018-02-17 DIAGNOSIS — I2510 Atherosclerotic heart disease of native coronary artery without angina pectoris: Secondary | ICD-10-CM | POA: Insufficient documentation

## 2018-02-17 DIAGNOSIS — Z8619 Personal history of other infectious and parasitic diseases: Secondary | ICD-10-CM | POA: Insufficient documentation

## 2018-02-17 DIAGNOSIS — Z6841 Body Mass Index (BMI) 40.0 and over, adult: Secondary | ICD-10-CM | POA: Diagnosis not present

## 2018-02-17 DIAGNOSIS — J45909 Unspecified asthma, uncomplicated: Secondary | ICD-10-CM | POA: Insufficient documentation

## 2018-02-17 DIAGNOSIS — K7460 Unspecified cirrhosis of liver: Secondary | ICD-10-CM | POA: Insufficient documentation

## 2018-02-17 DIAGNOSIS — I10 Essential (primary) hypertension: Secondary | ICD-10-CM | POA: Insufficient documentation

## 2018-02-17 DIAGNOSIS — Z7982 Long term (current) use of aspirin: Secondary | ICD-10-CM | POA: Insufficient documentation

## 2018-02-17 DIAGNOSIS — K210 Gastro-esophageal reflux disease with esophagitis: Secondary | ICD-10-CM | POA: Diagnosis not present

## 2018-02-17 DIAGNOSIS — K2980 Duodenitis without bleeding: Secondary | ICD-10-CM | POA: Diagnosis not present

## 2018-02-17 HISTORY — DX: Restless legs syndrome: G25.81

## 2018-02-17 HISTORY — DX: Atherosclerotic heart disease of native coronary artery without angina pectoris: I25.10

## 2018-02-17 HISTORY — DX: Unspecified viral hepatitis C without hepatic coma: B19.20

## 2018-02-17 HISTORY — PX: ESOPHAGOGASTRODUODENOSCOPY (EGD) WITH PROPOFOL: SHX5813

## 2018-02-17 HISTORY — DX: Deficiency of other specified B group vitamins: E53.8

## 2018-02-17 HISTORY — DX: Peptic ulcer, site unspecified, unspecified as acute or chronic, without hemorrhage or perforation: K27.9

## 2018-02-17 LAB — PROTIME-INR
INR: 0.94
Prothrombin Time: 12.5 seconds (ref 11.4–15.2)

## 2018-02-17 LAB — CBC
HEMATOCRIT: 47.6 % — AB (ref 36.0–46.0)
Hemoglobin: 15.3 g/dL — ABNORMAL HIGH (ref 12.0–15.0)
MCH: 31.8 pg (ref 26.0–34.0)
MCHC: 32.1 g/dL (ref 30.0–36.0)
MCV: 99 fL (ref 80.0–100.0)
NRBC: 0 % (ref 0.0–0.2)
Platelets: 282 10*3/uL (ref 150–400)
RBC: 4.81 MIL/uL (ref 3.87–5.11)
RDW: 14.3 % (ref 11.5–15.5)
WBC: 9 10*3/uL (ref 4.0–10.5)

## 2018-02-17 SURGERY — ESOPHAGOGASTRODUODENOSCOPY (EGD) WITH PROPOFOL
Anesthesia: General

## 2018-02-17 MED ORDER — LIDOCAINE HCL (CARDIAC) PF 100 MG/5ML IV SOSY
PREFILLED_SYRINGE | INTRAVENOUS | Status: DC | PRN
  Administered 2018-02-17: 30 mg via INTRAVENOUS

## 2018-02-17 MED ORDER — ONDANSETRON HCL 4 MG/2ML IJ SOLN
INTRAMUSCULAR | Status: AC
  Filled 2018-02-17: qty 2

## 2018-02-17 MED ORDER — PROPOFOL 500 MG/50ML IV EMUL
INTRAVENOUS | Status: AC
  Filled 2018-02-17: qty 50

## 2018-02-17 MED ORDER — FENTANYL CITRATE (PF) 100 MCG/2ML IJ SOLN
INTRAMUSCULAR | Status: DC | PRN
  Administered 2018-02-17: 50 ug via INTRAVENOUS

## 2018-02-17 MED ORDER — LIDOCAINE HCL (PF) 1 % IJ SOLN
INTRAMUSCULAR | Status: AC
  Filled 2018-02-17: qty 2

## 2018-02-17 MED ORDER — LIDOCAINE HCL (PF) 2 % IJ SOLN
INTRAMUSCULAR | Status: AC
  Filled 2018-02-17: qty 10

## 2018-02-17 MED ORDER — PROPOFOL 500 MG/50ML IV EMUL
INTRAVENOUS | Status: DC | PRN
  Administered 2018-02-17: 120 ug/kg/min via INTRAVENOUS

## 2018-02-17 MED ORDER — MIDAZOLAM HCL 2 MG/2ML IJ SOLN
INTRAMUSCULAR | Status: AC
  Filled 2018-02-17: qty 2

## 2018-02-17 MED ORDER — MIDAZOLAM HCL 2 MG/2ML IJ SOLN
INTRAMUSCULAR | Status: DC | PRN
  Administered 2018-02-17: 1 mg via INTRAVENOUS

## 2018-02-17 MED ORDER — SODIUM CHLORIDE 0.9 % IV SOLN
INTRAVENOUS | Status: DC
  Administered 2018-02-17: 1000 mL via INTRAVENOUS

## 2018-02-17 MED ORDER — FENTANYL CITRATE (PF) 100 MCG/2ML IJ SOLN
INTRAMUSCULAR | Status: AC
  Filled 2018-02-17: qty 2

## 2018-02-17 NOTE — Anesthesia Preprocedure Evaluation (Addendum)
Anesthesia Evaluation  Patient identified by MRN, date of birth, ID band Patient awake    Reviewed: Allergy & Precautions, H&P , NPO status , Patient's Chart, lab work & pertinent test results  History of Anesthesia Complications (+) PONV and history of anesthetic complications  Airway Mallampati: III       Dental  (+) Partial Upper, Missing, Dental Advidsory Given, Poor Dentition   Pulmonary neg shortness of breath, asthma , neg sleep apnea, neg recent URI, former smoker,     + decreased breath sounds      Cardiovascular hypertension, (-) angina+ CAD, + Past MI and +CHF  (-) Cardiac Stents and (-) CABG + dysrhythmias (-) Valvular Problems/Murmurs Rhythm:Regular     Neuro/Psych PSYCHIATRIC DISORDERS Anxiety Depression negative neurological ROS     GI/Hepatic PUD, GERD  ,(+) Hepatitis -, C  Endo/Other  neg diabetesMorbid obesity  Renal/GU negative Renal ROS  negative genitourinary   Musculoskeletal  (+) Arthritis , Osteoarthritis,    Abdominal (+) + obese,   Peds negative pediatric ROS (+)  Hematology negative hematology ROS (+)   Anesthesia Other Findings Past Medical History: 6 years ago: Acute MI (HCC) No date: Anxiety No date: Arthritis No date: Asthma No date: B12 deficiency No date: Bronchitis No date: Chronic back pain No date: Complication of anesthesia     Comment:  Rash and breathing difficulties r/t some local               anesthetic No date: Coronary artery disease No date: Costal chondritis No date: Depression No date: Diverticulosis No date: Dysrhythmia No date: GERD (gastroesophageal reflux disease)     Comment:  history of ulcers No date: H/O: GI bleed No date: Hepatitis C No date: Hepatitis C infection No date: History of kidney stones No date: HOH (hard of hearing) No date: Hx of ectopic pregnancy No date: Hypertension No date: Lumbar herniated disc No date: Peptic ulcer  disease No date: Pneumonia No date: PONV (postoperative nausea and vomiting) No date: RLS (restless legs syndrome)   Reproductive/Obstetrics negative OB ROS                            Anesthesia Physical  Anesthesia Plan  ASA: III  Anesthesia Plan: General   Post-op Pain Management:    Induction: Intravenous  PONV Risk Score and Plan: 4 or greater and Propofol infusion and TIVA  Airway Management Planned: Natural Airway and Nasal Cannula  Additional Equipment:   Intra-op Plan:   Post-operative Plan:   Informed Consent: I have reviewed the patients History and Physical, chart, labs and discussed the procedure including the risks, benefits and alternatives for the proposed anesthesia with the patient or authorized representative who has indicated his/her understanding and acceptance.   Dental Advisory Given  Plan Discussed with: Anesthesiologist, CRNA and Surgeon  Anesthesia Plan Comments:         Anesthesia Quick Evaluation

## 2018-02-17 NOTE — Transfer of Care (Signed)
Immediate Anesthesia Transfer of Care Note  Patient: Kayla Stout  Procedure(s) Performed: ESOPHAGOGASTRODUODENOSCOPY (EGD) WITH PROPOFOL (N/A )  Patient Location: PACU  Anesthesia Type:General  Level of Consciousness: awake and sedated  Airway & Oxygen Therapy: Patient Spontanous Breathing and Patient connected to nasal cannula oxygen  Post-op Assessment: Report given to RN and Post -op Vital signs reviewed and stable  Post vital signs: Reviewed and stable  Last Vitals:  Vitals Value Taken Time  BP    Temp    Pulse    Resp    SpO2      Last Pain:  Vitals:   02/17/18 1017  TempSrc: Tympanic  PainSc: 0-No pain         Complications: No apparent anesthesia complications

## 2018-02-17 NOTE — Anesthesia Postprocedure Evaluation (Signed)
Anesthesia Post Note  Patient: Kayla Stout  Procedure(s) Performed: ESOPHAGOGASTRODUODENOSCOPY (EGD) WITH PROPOFOL (N/A )  Patient location during evaluation: Endoscopy Anesthesia Type: General Level of consciousness: awake and alert Pain management: pain level controlled Vital Signs Assessment: post-procedure vital signs reviewed and stable Respiratory status: spontaneous breathing, nonlabored ventilation, respiratory function stable and patient connected to nasal cannula oxygen Cardiovascular status: blood pressure returned to baseline and stable Postop Assessment: no apparent nausea or vomiting Anesthetic complications: no     Last Vitals:  Vitals:   02/17/18 1300 02/17/18 1310  BP: 113/69 109/65  Pulse: 83 80  Resp: (!) 24 19  Temp:    SpO2: 94% 96%    Last Pain:  Vitals:   02/17/18 1237  TempSrc: Tympanic  PainSc:                  Cleda Mccreedy Zadyn Yardley

## 2018-02-17 NOTE — Op Note (Signed)
[facility] Gastroenterology Patient Name: Kayla Stout Procedure Date: 02/17/2018 12:01 PM MRN: [id number] Account #: [id number] Date of Birth: [birth year] Admit Type: Outpatient Age: 67 Room: [facility] ENDO ROOM 1 Gender: Female Note Status: Finalized Procedure:            Upper GI endoscopy Indications:          Cirrhosis rule out esophageal varices Providers:            Christena Deem, MD Referring MD:         Danella Penton, MD (Referring MD) Medicines:            Monitored Anesthesia Care Complications:        No immediate complications. Procedure:            Pre-Anesthesia Assessment:                       - ASA Grade Assessment: III - A patient with severe                        systemic disease.                       After obtaining informed consent, the endoscope was                        passed under direct vision. Throughout the procedure,                        the patient's blood pressure, pulse, and oxygen                        saturations were monitored continuously. The Endoscope                        was introduced through the mouth, and advanced to the                        third part of duodenum. The upper GI endoscopy was                        accomplished without difficulty. The patient tolerated                        the procedure well. Findings:      LA Grade B (one or more mucosal breaks greater than 5 mm, not extending       between the tops of two mucosal folds) esophagitis with no bleeding was       found. Biopsies were taken with a cold forceps for histology.      there is no evidence of esophageal or gastric varices.      The exam of the esophagus was otherwise normal.      Diffuse moderate inflammation characterized by congestion (edema),       erosions and erythema was found in the entire examined stomach.      One non-bleeding superficial gastric ulcer with no stigmata of bleeding       was found at the incisura. The lesion  was 4 mm in largest dimension.      The cardia and gastric fundus were normal on retroflexion otherwise.      Patchy moderate  inflammation characterized by erosions and erythema was       found in the duodenal bulb.      Diffuse and patchy mild mucosal variance characterized by smoothness was       found in the second portion of the duodenum and in the third portion of       the duodenum. Biopsies were taken with a cold forceps for histology. Impression:           - LA Grade B erosive esophagitis. Biopsied.                       - Erosive gastritis.                       - Non-bleeding gastric ulcer with no stigmata of                        bleeding.                       - Erosive duodenitis.                       - Mucosal variant in the duodenum. Biopsied. Recommendation:       - Use Protonix (pantoprazole) 40 mg PO daily daily.                       - Await pathology results.                       - Return to GI clinic in 3 weeks. Procedure Code(s):    --- Professional ---                       [phone], Esophagogastroduodenoscopy, flexible, transoral;                        with biopsy, single or multiple Diagnosis Code(s):    --- Professional ---                       K20.8, Other esophagitis                       K29.60, Other gastritis without bleeding                       K25.9, Gastric ulcer, unspecified as acute or chronic,                        without hemorrhage or perforation                       K29.80, Duodenitis without bleeding                       K31.89, Other diseases of stomach and duodenum                       K74.60, Unspecified cirrhosis of liver CPT copyright 2018 American Medical Association. All rights reserved. The codes documented in this report are preliminary and upon coder review may  be revised to meet current compliance requirements. Christena Deem, MD 02/17/2018 12:36:54 PM This report has been signed electronically. Number of Addenda:  0 Note Initiated On: 02/17/2018 12:01 PM      [facility]

## 2018-02-17 NOTE — Anesthesia Procedure Notes (Signed)
Performed by: Tonia Ghent Pre-anesthesia Checklist: Patient identified, Emergency Drugs available, Patient being monitored, Suction available and Timeout performed Patient Re-evaluated:Patient Re-evaluated prior to induction Oxygen Delivery Method: Simple face mask Preoxygenation: Pre-oxygenation with 100% oxygen Induction Type: IV induction Airway Equipment and Method: Bite block Placement Confirmation: positive ETCO2 and CO2 detector

## 2018-02-17 NOTE — H&P (Signed)
Outpatient short stay form Pre-procedure 02/17/2018 12:09 PM Christena Deem MD  Primary Physician: Dr. Bethann Punches  Reason for visit: EGD  History of present illness: Patient is a 67 year old female with a diagnosis of cirrhosis of the liver.  She is presenting today for an EGD guards to ruling out varices.  Her CBC and pro time were checked today with the platelet count being 282 and the pro time draw an INR of 0.94.  Takes 81 mg aspirin daily but has held that for several days.  She takes no other aspirin products or blood thinning agent.  She denies any nausea vomiting or abdominal pain there is no heartburn or dysphagia.    Current Facility-Administered Medications:  .  0.9 %  sodium chloride infusion, , Intravenous, Continuous, Christena Deem, MD, Last Rate: 20 mL/hr at 02/17/18 1120 .  lidocaine (PF) (XYLOCAINE) 1 % injection, , , ,   Medications Prior to Admission  Medication Sig Dispense Refill Last Dose  . albuterol (PROVENTIL) (2.5 MG/3ML) 0.083% nebulizer solution Take 3 mLs (2.5 mg total) by nebulization every 6 (six) hours as needed for wheezing or shortness of breath. 75 mL 12 Past Week at Unknown time  . ALPRAZolam (XANAX) 1 MG tablet Take 1 mg by mouth at bedtime as needed. For sleep    Past Week at Unknown time  . aspirin 81 MG tablet Take 162 mg by mouth daily. 2 81 mg aspirin   Past Week at Unknown time  . B Complex-C (B-COMPLEX WITH VITAMIN C) tablet Take 1 tablet by mouth daily.   Past Week at Unknown time  . cetirizine (ZYRTEC) 10 MG tablet Take 10 mg by mouth daily.   Past Week at Unknown time  . EPINEPHrine (EPIPEN 2-PAK) 0.3 mg/0.3 mL IJ SOAJ injection Inject 0.3 mg into the muscle once as needed.   Past Week at Unknown time  . Ipratropium-Albuterol (COMBIVENT IN) Inhale into the lungs.   Past Week at Unknown time  . lisinopril (PRINIVIL,ZESTRIL) 5 MG tablet Take 5 mg by mouth daily. hs   Past Week at Unknown time  . metoprolol (TOPROL-XL) 50 MG 24 hr tablet  Take 50 mg by mouth at bedtime.    Past Week at Unknown time  . Travoprost, BAK Free, (TRAVATAN) 0.004 % SOLN ophthalmic solution Place 1 drop into both eyes at bedtime.    Past Week at Unknown time     Allergies  Allergen Reactions  . Bee Pollen Anaphylaxis  . Codeine     Pt states internal bleeding.  . Peanut-Containing Drug Products Swelling  . Lidocaine     vomiting  . Novocain [Procaine]   . Penicillins Hives     Past Medical History:  Diagnosis Date  . Acute MI (HCC) 6 years ago  . Anxiety   . Arthritis   . Asthma   . B12 deficiency   . Bronchitis   . Chronic back pain   . Complication of anesthesia    Rash and breathing difficulties r/t some local anesthetic  . Coronary artery disease   . Costal chondritis   . Depression   . Diverticulosis   . Dysrhythmia   . GERD (gastroesophageal reflux disease)    history of ulcers  . H/O: GI bleed   . Hepatitis C   . Hepatitis C infection   . History of kidney stones   . HOH (hard of hearing)   . Hx of ectopic pregnancy   . Hypertension   .  Lumbar herniated disc   . Peptic ulcer disease   . Pneumonia   . PONV (postoperative nausea and vomiting)   . RLS (restless legs syndrome)     Review of systems:      Physical Exam    Heart and lungs: The rate and rhythm without rub or gallop, lungs are bilaterally clear.    HEENT: Normocephalic atraumatic eyes are anicteric    Other:    Pertinant exam for procedure: Soft nontender nondistended bowel sounds positive normoactive.    Planned proceedures: EGD and indicated procedures. I have discussed the risks benefits and complications of procedures to include not limited to bleeding, infection, perforation and the risk of sedation and the patient wishes to proceed.    Christena Deem, MD Gastroenterology 02/17/2018  12:09 PM

## 2018-02-17 NOTE — Anesthesia Post-op Follow-up Note (Signed)
Anesthesia QCDR form completed.        

## 2018-02-18 ENCOUNTER — Encounter: Payer: Self-pay | Admitting: Gastroenterology

## 2018-02-18 LAB — SURGICAL PATHOLOGY

## 2018-03-18 ENCOUNTER — Other Ambulatory Visit: Payer: Self-pay | Admitting: Gastroenterology

## 2018-03-18 DIAGNOSIS — K7460 Unspecified cirrhosis of liver: Secondary | ICD-10-CM

## 2018-05-18 ENCOUNTER — Ambulatory Visit
Admission: RE | Admit: 2018-05-18 | Discharge: 2018-05-18 | Disposition: A | Discharge status: Home / Self Care | Payer: Medicare Other | Source: Ambulatory Visit | Attending: Gastroenterology | Admitting: Gastroenterology

## 2018-05-18 DIAGNOSIS — K7460 Unspecified cirrhosis of liver: Secondary | ICD-10-CM | POA: Insufficient documentation

## 2018-12-12 ENCOUNTER — Other Ambulatory Visit: Payer: Self-pay | Admitting: Internal Medicine

## 2018-12-12 DIAGNOSIS — Z1231 Encounter for screening mammogram for malignant neoplasm of breast: Secondary | ICD-10-CM

## 2019-01-12 ENCOUNTER — Other Ambulatory Visit: Payer: Self-pay | Admitting: Internal Medicine

## 2019-01-12 DIAGNOSIS — M5116 Intervertebral disc disorders with radiculopathy, lumbar region: Secondary | ICD-10-CM

## 2019-01-18 ENCOUNTER — Ambulatory Visit
Admission: RE | Admit: 2019-01-18 | Discharge: 2019-01-18 | Disposition: A | Discharge status: Home / Self Care | Payer: Medicare Other | Source: Ambulatory Visit | Attending: Internal Medicine | Admitting: Internal Medicine

## 2019-01-18 DIAGNOSIS — Z1231 Encounter for screening mammogram for malignant neoplasm of breast: Secondary | ICD-10-CM | POA: Insufficient documentation

## 2019-01-26 ENCOUNTER — Other Ambulatory Visit: Payer: Self-pay

## 2019-01-26 ENCOUNTER — Ambulatory Visit
Admission: RE | Admit: 2019-01-26 | Discharge: 2019-01-26 | Disposition: A | Discharge status: Home / Self Care | Payer: Medicare Other | Source: Ambulatory Visit | Attending: Internal Medicine | Admitting: Internal Medicine

## 2019-01-26 DIAGNOSIS — M5116 Intervertebral disc disorders with radiculopathy, lumbar region: Secondary | ICD-10-CM | POA: Diagnosis not present

## 2019-10-25 IMAGING — US US ABDOMEN COMPLETE W/ ELASTOGRAPHY
1 series · 13 of 22 positions shown · non-contrast
Comparison: None.

CLINICAL DATA: Fatty liver



[Series 1: us abdomen complete w/ elastography · 0.23mm/px · 13 of 22 slices shown]
[im 1/22]
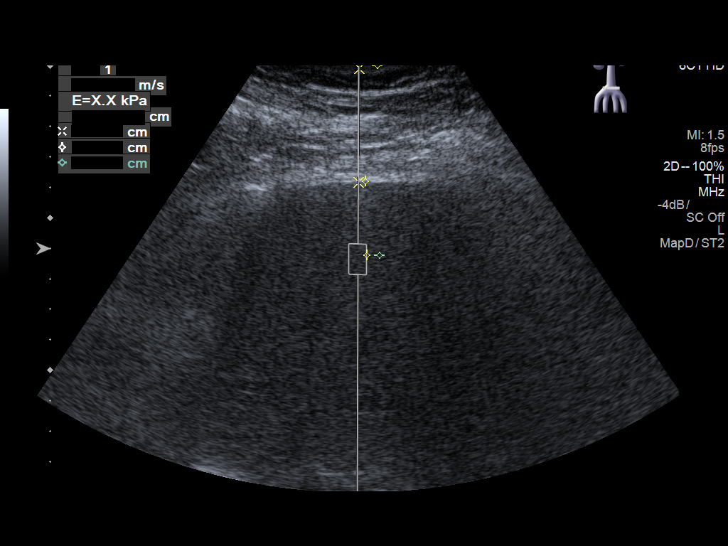
[im 3/22]
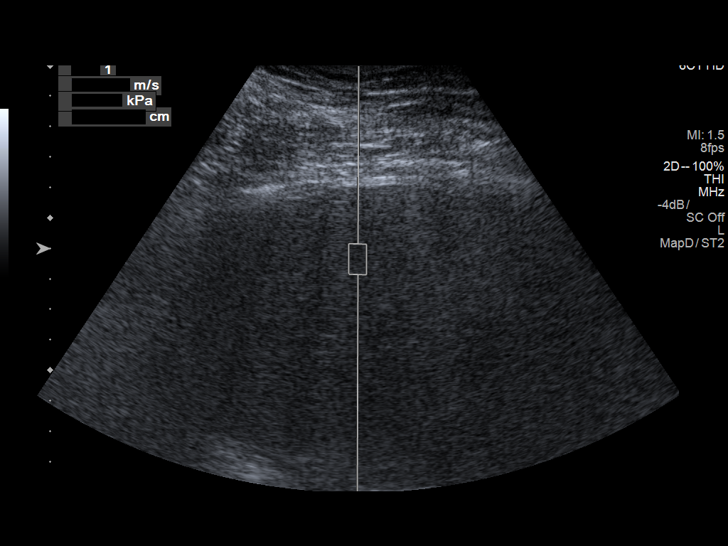
[im 5/22]
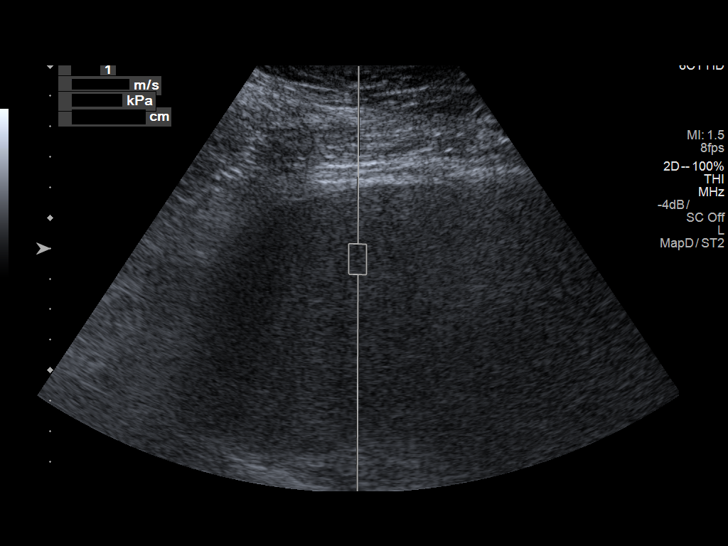
[im 6/22]
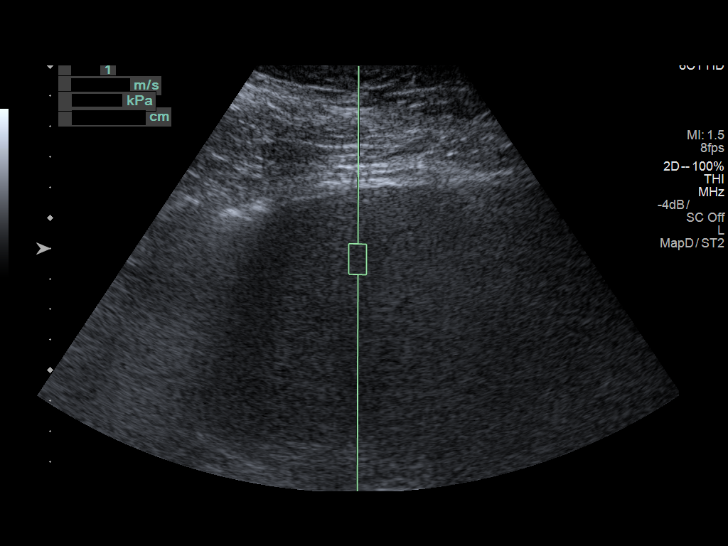
[im 8/22]
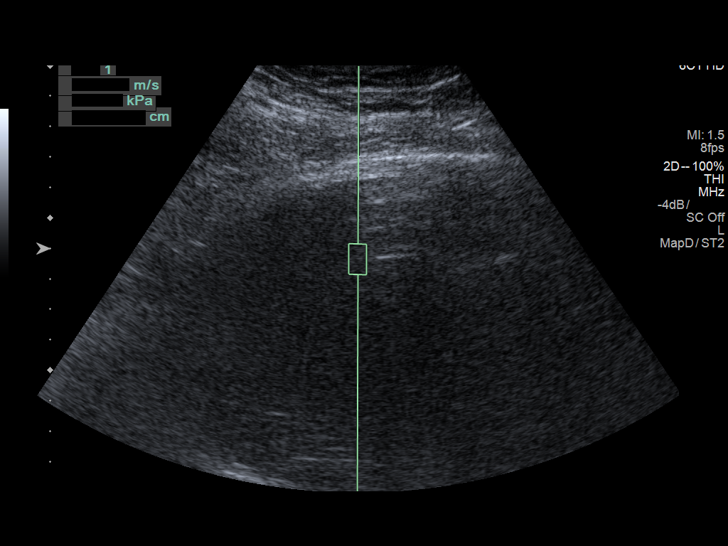
[im 10/22]
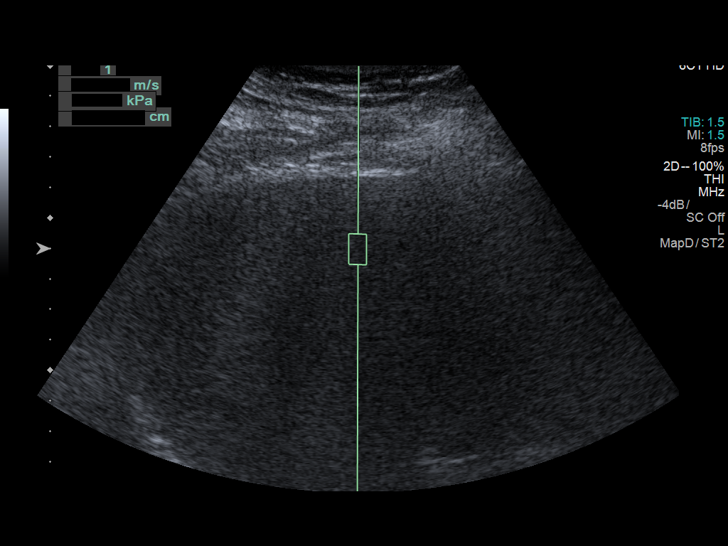
[im 12/22]
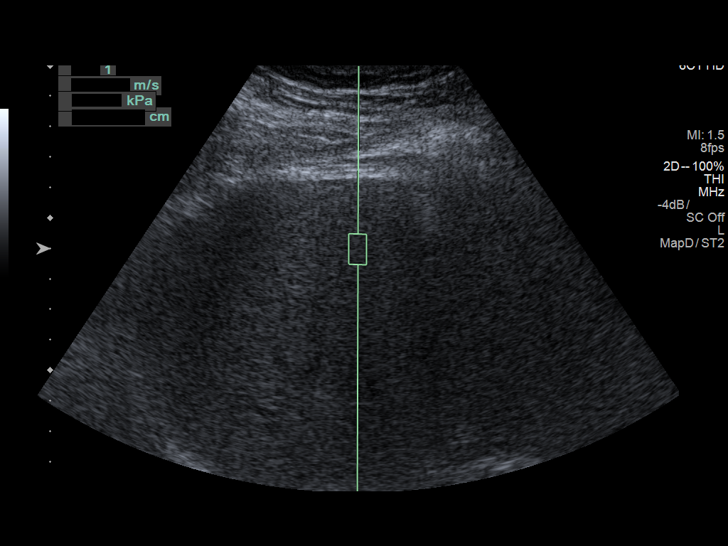
[im 13/22]
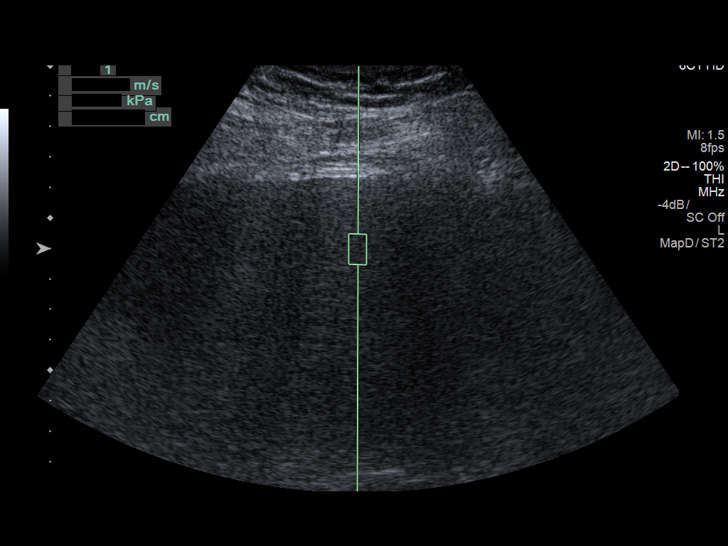
[im 15/22]
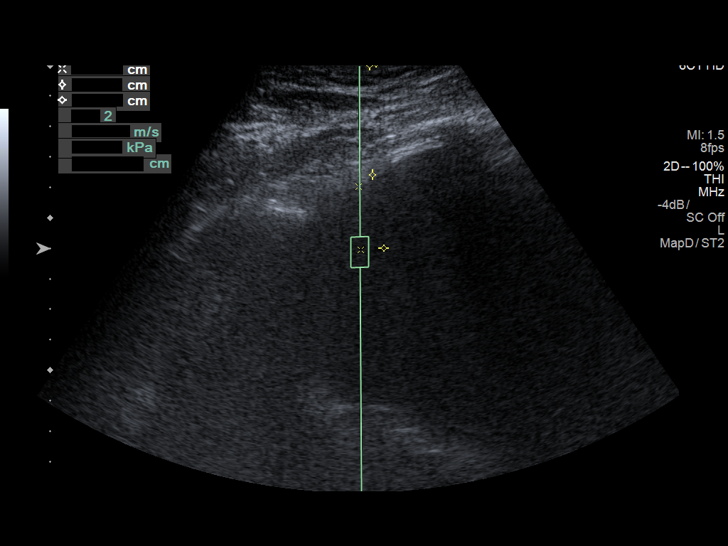
[im 17/22]
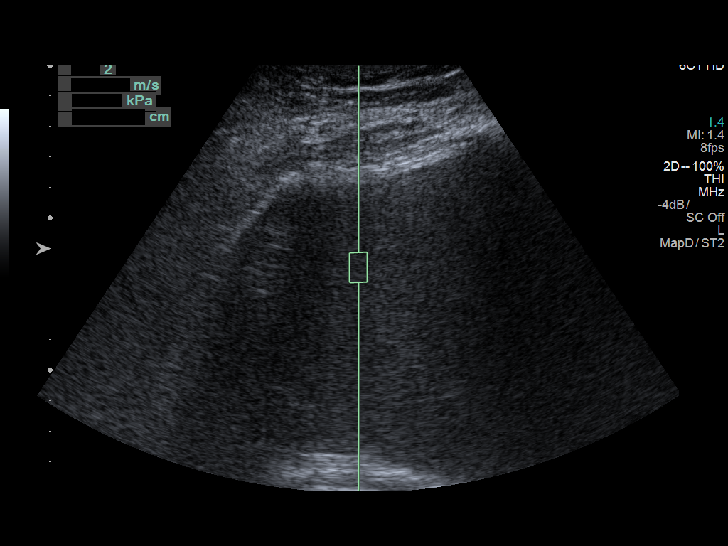
[im 18/22]
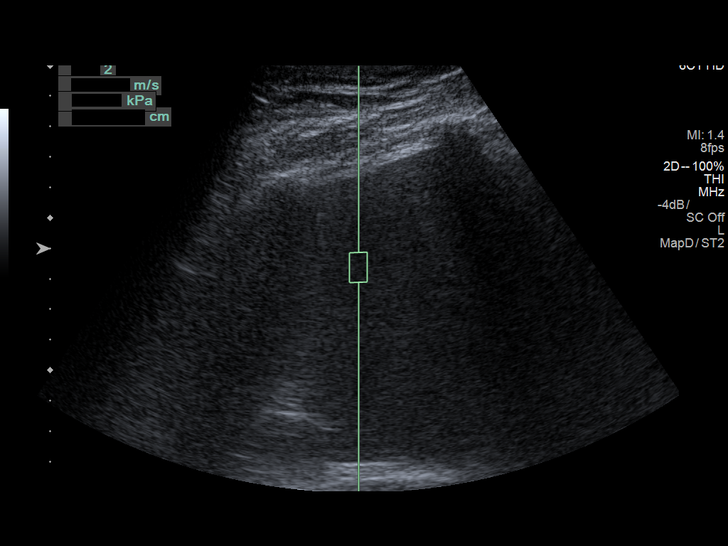
[im 20/22]
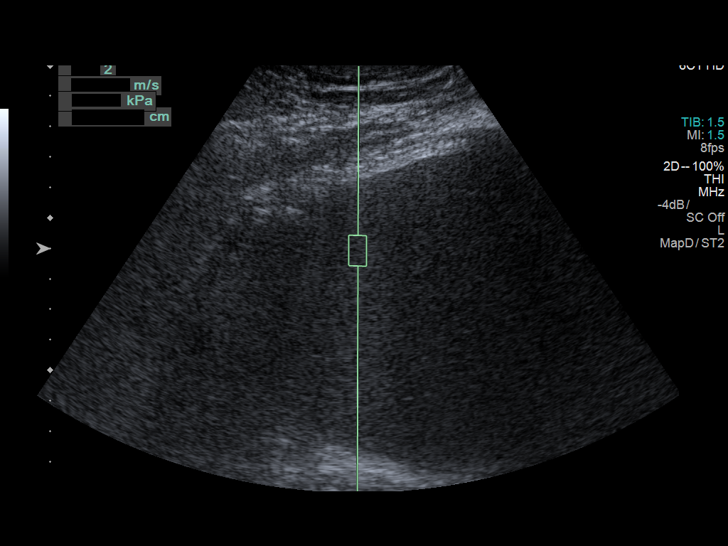
[im 22/22]
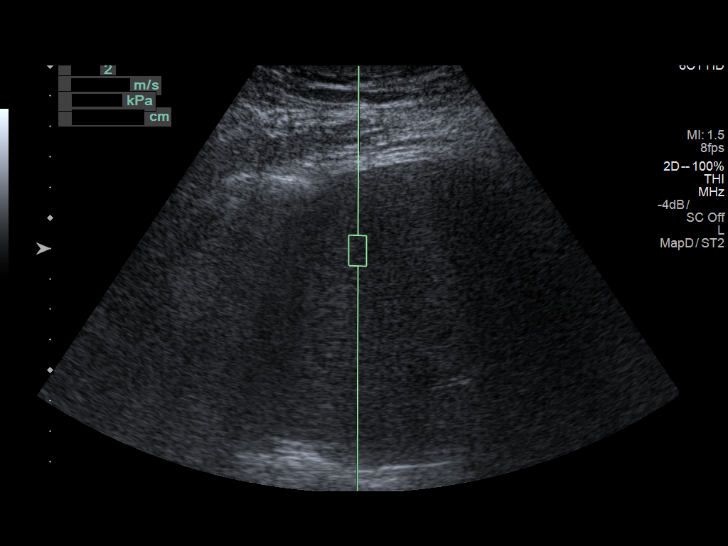

[13 of 22 positions shown; findings below may reference images not displayed]

FINDINGS: ULTRASOUND ABDOMEN

Gallbladder: Prior cholecystectomy

Common bile duct: Diameter: Normal caliber, 6 mm

Liver: Increased echotexture compatible with fatty infiltration. No
focal abnormality or biliary ductal dilatation. Portal vein is
patent on color Doppler imaging with normal direction of blood flow
towards the liver.

IVC: No abnormality visualized.

Pancreas: Visualized portion unremarkable.

Spleen: Size and appearance within normal limits.

Right Kidney: Length: 11.7 cm. Echogenicity within normal limits. No
mass or hydronephrosis visualized.

Left Kidney: Length: 11.4 cm. Normal echotexture. No hydronephrosis.
Small 1.3 cm cyst.

Abdominal aorta: No aneurysm visualized.

Other findings: None.

ULTRASOUND HEPATIC ELASTOGRAPHY

Device: Siemens Helix VTQ

Patient position: Oblique

Transducer 6C1

Number of measurements: 8

Hepatic segment:  7

Median velocity:   3.32 m/sec

IQR:

IQR/Median velocity ratio:

Corresponding Metavir fibrosis score:  Some F3 + F4

Risk of fibrosis: High

Limitations of exam: None

Pertinent findings noted on other imaging exams:  None

Please note that abnormal shear wave velocities may also be
identified in clinical settings other than with hepatic fibrosis,
such as: acute hepatitis, elevated right heart and central venous
pressures including use of beta blockers, Kalen disease
(Novaldo), infiltrative processes such as
mastocytosis/amyloidosis/infiltrative tumor, extrahepatic
cholestasis, in the post-prandial state, and liver transplantation.
Correlation with patient history, laboratory data, and clinical
condition recommended.
IMPRESSION: ULTRASOUND ABDOMEN:
Fatty infiltration of the liver.

Prior cholecystectomy.

ULTRASOUND HEPATIC ELASTOGRAPHY:

Median hepatic shear wave velocity is calculated at 3.32 m/sec.

Corresponding Metavir fibrosis score is  Some F3 + F4.

Risk of fibrosis is High.

Follow-up: Follow up advised

## 2019-12-29 ENCOUNTER — Other Ambulatory Visit: Payer: Self-pay | Admitting: Podiatry

## 2019-12-29 ENCOUNTER — Other Ambulatory Visit (HOSPITAL_COMMUNITY): Payer: Self-pay | Admitting: Podiatry

## 2019-12-29 DIAGNOSIS — M19071 Primary osteoarthritis, right ankle and foot: Secondary | ICD-10-CM

## 2020-01-17 ENCOUNTER — Ambulatory Visit
Admission: RE | Admit: 2020-01-17 | Discharge: 2020-01-17 | Disposition: A | Discharge status: Home / Self Care | Payer: Medicare Other | Source: Ambulatory Visit | Attending: Podiatry | Admitting: Podiatry

## 2020-01-17 ENCOUNTER — Other Ambulatory Visit: Payer: Self-pay

## 2020-01-17 DIAGNOSIS — M19071 Primary osteoarthritis, right ankle and foot: Secondary | ICD-10-CM | POA: Diagnosis present

## 2020-02-02 ENCOUNTER — Other Ambulatory Visit: Payer: Self-pay | Admitting: Internal Medicine

## 2020-02-02 DIAGNOSIS — Z1231 Encounter for screening mammogram for malignant neoplasm of breast: Secondary | ICD-10-CM

## 2020-02-22 ENCOUNTER — Other Ambulatory Visit: Payer: Self-pay | Admitting: Rehabilitative and Restorative Service Providers"

## 2020-02-22 DIAGNOSIS — I2510 Atherosclerotic heart disease of native coronary artery without angina pectoris: Secondary | ICD-10-CM

## 2020-02-22 DIAGNOSIS — R079 Chest pain, unspecified: Secondary | ICD-10-CM

## 2020-02-26 ENCOUNTER — Other Ambulatory Visit: Payer: Self-pay

## 2020-02-26 ENCOUNTER — Ambulatory Visit
Admission: RE | Admit: 2020-02-26 | Discharge: 2020-02-26 | Disposition: A | Discharge status: Home / Self Care | Payer: Medicare Other | Source: Ambulatory Visit | Attending: Internal Medicine | Admitting: Internal Medicine

## 2020-02-26 DIAGNOSIS — Z1231 Encounter for screening mammogram for malignant neoplasm of breast: Secondary | ICD-10-CM | POA: Diagnosis not present

## 2020-02-29 ENCOUNTER — Ambulatory Visit
Admission: RE | Admit: 2020-02-29 | Discharge: 2020-02-29 | Disposition: A | Discharge status: Home / Self Care | Payer: Medicare Other | Source: Ambulatory Visit | Attending: Rehabilitative and Restorative Service Providers" | Admitting: Rehabilitative and Restorative Service Providers"

## 2020-02-29 ENCOUNTER — Other Ambulatory Visit: Payer: Self-pay

## 2020-02-29 DIAGNOSIS — I2510 Atherosclerotic heart disease of native coronary artery without angina pectoris: Secondary | ICD-10-CM | POA: Diagnosis present

## 2020-02-29 DIAGNOSIS — R079 Chest pain, unspecified: Secondary | ICD-10-CM

## 2020-02-29 LAB — NM MYOCAR MULTI W/SPECT W/WALL MOTION / EF: LV dias vol: 115 mL (ref 46–106)

## 2020-02-29 MED ORDER — TECHNETIUM TC 99M TETROFOSMIN IV KIT
9.9650 | PACK | Freq: Once | INTRAVENOUS | Status: AC | PRN
  Administered 2020-02-29: 9.965 via INTRAVENOUS

## 2020-02-29 MED ORDER — REGADENOSON 0.4 MG/5ML IV SOLN
0.4000 mg | Freq: Once | INTRAVENOUS | Status: AC
  Administered 2020-02-29: 0.4 mg via INTRAVENOUS
  Filled 2020-02-29: qty 5

## 2020-02-29 MED ORDER — TECHNETIUM TC 99M TETROFOSMIN IV KIT
30.3400 | PACK | Freq: Once | INTRAVENOUS | Status: AC | PRN
  Administered 2020-02-29: 30.34 via INTRAVENOUS

## 2020-03-04 ENCOUNTER — Other Ambulatory Visit: Payer: Self-pay | Admitting: Podiatry

## 2020-03-04 LAB — NM MYOCAR MULTI W/SPECT W/WALL MOTION / EF
Estimated workload: 1 METS
Exercise duration (min): 1 min
Exercise duration (sec): 11 s
LV sys vol: 51 mL
MPHR: 153 {beats}/min
Peak HR: 105 {beats}/min
Percent HR: 68 %
Rest HR: 70 {beats}/min
SDS: 5
SRS: 34
SSS: 25
TID: 1.23

## 2020-03-14 ENCOUNTER — Other Ambulatory Visit: Payer: Self-pay

## 2020-03-14 ENCOUNTER — Encounter
Admission: RE | Admit: 2020-03-14 | Discharge: 2020-03-14 | Disposition: A | Discharge status: Home / Self Care | Payer: Medicare Other | Source: Ambulatory Visit | Attending: Podiatry | Admitting: Podiatry

## 2020-03-14 DIAGNOSIS — Z01818 Encounter for other preprocedural examination: Secondary | ICD-10-CM | POA: Diagnosis present

## 2020-03-14 HISTORY — DX: Chronic obstructive pulmonary disease, unspecified: J44.9

## 2020-03-14 NOTE — Patient Instructions (Addendum)
Your procedure is scheduled on:03-22-20 FRIDAY Report to Day Surgery on the 2nd floor of the [facility]. To find out your arrival time, please call [phone] between 1PM - 3PM on:03-21-20 THURSDAY   REMEMBER: Instructions that are not followed completely may result in serious medical risk, up to and including death; or upon the discretion of your surgeon and anesthesiologist your surgery may need to be rescheduled.  Do not eat food after midnight the night before surgery.  No gum chewing, lozengers or hard candies.  You may however, drink CLEAR liquids up to 2 hours before you are scheduled to arrive for your surgery. Do not drink anything within 2 hours of your scheduled arrival time.  Clear liquids include: - water  - apple juice without pulp - gatorade (not RED, PURPLE, OR BLUE) - black coffee or tea (Do NOT add milk or creamers to the coffee or tea) Do NOT drink anything that is not on this list.  In addition, your doctor has ordered for you to drink the provided  Ensure Pre-Surgery Clear Carbohydrate Drink  Drinking this carbohydrate drink up to two hours before surgery helps to reduce insulin resistance and improve patient outcomes. Please complete drinking 2 hours prior to scheduled arrival time.  TAKE THESE MEDICATIONS THE MORNING OF SURGERY WITH A SIP OF WATER: -PROTONIX (PANTOPRAZOLE)  Use inhalers on the day of surgery-USE YOUR ANORO ELLIPTA THE MORNING OF YOUR SURGERY  Follow recommendations from Cardiologist, Pulmonologist or PCP regarding stopping Aspirin, Coumadin, Plavix, Eliquis, Pradaxa, or Pletal-ASK DR FATH'S OFFICE ABOUT WHEN YOU NEED TO STOP YOUR ASPIRIN  One week prior to surgery: Stop Anti-inflammatories (NSAIDS) such as LODINE (ETODOLAC), Advil, Aleve, Ibuprofen, Motrin, Naproxen, Naprosyn and Aspirin based products such as Excedrin, Goodys Powder, BC Powder-OK TO TAKE TYLENOL IF NEEDED  Stop ANY OVER THE COUNTER supplements until after surgery-STOP  VITAMIN E NOW-YOU MAY RESUME AFTER YOUR SURGERY (YOU MAY CONTINUE YOUR VITAMIN B 12)  No Alcohol for 24 hours before or after surgery.  No Smoking including e-cigarettes for 24 hours prior to surgery.  No chewable tobacco products for at least 6 hours prior to surgery.  No nicotine patches on the day of surgery.  Do not use any "recreational" drugs for at least a week prior to your surgery.  Please be advised that the combination of cocaine and anesthesia may have negative outcomes, up to and including death. If you test positive for cocaine, your surgery will be cancelled.  On the morning of surgery brush your teeth with toothpaste and water, you may rinse your mouth with mouthwash if you wish. Do not swallow any toothpaste or mouthwash.  Do not wear jewelry, make-up, hairpins, clips or nail polish.  Do not wear lotions, powders, or perfumes.   Do not shave 48 hours prior to surgery.   Contact lenses, hearing aids and dentures may not be worn into surgery.  Do not bring valuables to the hospital. [facility] is not responsible for any missing/lost belongings or valuables.   Use CHG Soap as directed on instruction sheet.   Notify your doctor if there is any change in your medical condition (cold, fever, infection).  Wear comfortable clothing (specific to your surgery type) to the hospital.  Plan for stool softeners for home use; pain medications have a tendency to cause constipation. You can also help prevent constipation by eating foods high in fiber such as fruits and vegetables and drinking plenty of fluids as your diet allows.  After  surgery, you can help prevent lung complications by doing breathing exercises.  Take deep breaths and cough every 1-2 hours. Your doctor may order a device called an Incentive Spirometer to help you take deep breaths. When coughing or sneezing, hold a pillow firmly against your incision with both hands. This is called splinting. Doing this helps  protect your incision. It also decreases belly discomfort.  If you are being admitted to the hospital overnight, leave your suitcase in the car. After surgery it may be brought to your room.  If you are being discharged the day of surgery, you will not be allowed to drive home. You will need a responsible adult (18 years or older) to drive you home and stay with you that night.   If you are taking public transportation, you will need to have a responsible adult (18 years or older) with you. Please confirm with your physician that it is acceptable to use public transportation.   Please call the Pre-admissions Testing Dept. at [phone] if you have any questions about these instructions.  Visitation Policy:  Patients undergoing a surgery or procedure may have one family member or support person with them as long as that person is not COVID-19 positive or experiencing its symptoms.  That person may remain in the waiting area during the procedure.  Inpatient Visitation Update:   In an effort to ensure the safety of our team members and our patients, we are implementing a change to our visitation policy:  Effective Monday, Aug. 9, at 7 a.m., inpatients will be allowed one support person.  o The support person may change daily.  o The support person must pass our screening, gel in and out, and wear a mask at all times, including in the patients room.  o Patients must also wear a mask when staff or their support person are in the room.  o Masking is required regardless of vaccination status.  Systemwide, no visitors 17 or younger.

## 2020-03-14 NOTE — Pre-Procedure Instructions (Signed)
ECG 12-lead  Component 3 wk ago  Vent Rate (bpm)  74     PR Interval (msec)  214     QRS Interval (msec)  130     QT Interval (msec)  400     QTc (msec)  444     Resulting Agency [facility] GE MUSE RESULTS  Narrative Performed by Roll[facility] GE MUSE RESULTS This result has an attachment that is not available.  Sinus rhythm 1st degree AV block with occasional premature ventricular complexes  Right bundle branch block    When compared with ECG of 14-Feb-2019 09:13,  premature ventricular complexes are now present  PR interval has increased   I reviewed and concur with this report. Electronically signed JS:HFWY MD, KEN [phone]) on 02/22/2020 4:53:52 PM Specimen Collected: 02/19/20 9:13 AM Last Resulted: 02/19/20 9:13 AM  Received From: [facility]  Result Received: 02/26/20 9:27 AM  Encounter Summary

## 2020-03-15 NOTE — Progress Notes (Signed)
[facility] Perioperative Services: Pre-Admission/Anesthesia Testing   Date: 03/15/20 Name: Kayla Stout MRN:   [id number]  Re: Consideration of preoperative prophylactic antibiotic change   Request sent to: Rosetta Posner, DPM (routed and/or faxed via [facility])  Planned Surgical Procedure(s):    Case: [id number] Date/Time: 03/22/20 0715   Procedures:      ACHILLES TENDON LENGTHENING (Right )     ARTHRODESIS FOOT- STJ RIGHT, NAVICULOCUNEFORM RIGHT (Right Foot)     LAPIDUS BUNIONECTOMY (Right Toe)   Anesthesia type: Choice   Pre-op diagnosis:      M19.071  OSTEOARTHRITIS SUBTALAR JOINT RIGHT     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 Patient has a documented allergy to PCN  . Advising that PCN has caused her to experience urticarial rash, nausea, and vomiting x 30 years ago.   2. Received cephalosporin with no documented complications  Oral CEFUROXIME on 08/23/2017  3. Screened as appropriate for cephalosporin use during medication reconciliation . No immediate angioedema, dysphagia, SOB, anaphylaxis symptoms. . No severe rash involving mucous membranes or skin necrosis. . No hospital admissions related to side effects of PCN/cephalosporin use.  . No documented reaction to PCN or cephalosporin in the last 10 years.  Request:  As an evidence based approach to reducing the rate of incidence for post-operative SSI and the development of MDROs, could an agent with narrower coverage for preoperative prophylaxis in this patient's upcoming surgical course be considered?  1. Currently ordered preoperative prophylactic ABX: clindamycin.   2. Specifically requesting change to cephalosporin (CEFAZOLIN).   3. Please communicate decision with me and I will change the orders in Epic as per your direction.   Things to  consider:  Many patients report that they were "allergic" to PCN earlier in life, however this does not translate into a true lifelong allergy. Patients can lose sensitivity to specific IgE antibodies over time if PCN is avoided (Kleris & Lugar, 2019).   Up to 10% of the adult population and 15% of hospitalized patients report an allergy to PCN, however clinical studies suggest that 90% of those reporting an allergy can tolerate PCN antibiotics (Kleris & Lugar, 2019).   Cross-sensitivity between PCN and cephalosporins has been documented as being as high as 10%, however this estimation included data believed to have been collected in a setting where there was contamination. Newer data suggests that the prevalence of cross-sensitivity between PCN and cephalosporins is actually estimated to be closer to 1% (Hermanides et al., 2018).    Patients labeled as PCN allergic, whether they are truly allergic or not, have been found to have inferior outcomes in terms of rates of serious infection, and these patients tend to have longer hospital stays [facility] & Lugar, 2019).   Treatment related secondary infections, such as Clostridioides difficile, have been linked to the improper use of broad spectrum antibiotics in patients improperly labeled as PCN allergic (Kleris & Lugar, 2019).   Anaphylaxis from cephalosporins is rare and the evidence suggests that there is no increased risk of an anaphylactic type reaction when cephalosporins are used in a PCN allergic patient (Pichichero, 2006).  Citations: Hermanides J, Lemkes BA, Prins Gwenyth Bender MW, Terreehorst I. Presumed ?-Lactam Allergy and Cross-reactivity in the Operating Theater: A Practical Approach. Anesthesiology. 2018 Aug;129(2):335-342. doi: 10.1097/ALN.0000000000002252. PMID: 46962952.  Kleris, R. S., & Lugar, P. L. (2019). Things We Do For No Reason: Failing to Question a Penicillin Allergy History. Journal of hospital medicine, 14(10), [phone].  Advance online publication. airportbarriers.com  Pichichero, M. E. (2006). Cephalosporins can be prescribed safely for penicillin-allergic patients. Journal of family medicine, 55(2), 106-112. Accessed: https://cdn.mdedge.com/files/s32fs-public/Document/September-2017/5502JFP_AppliedEvidence1.pdf   Quentin Mulling, MSN, APRN, FNP-C, CEN [facility]  Peri-operative Services Nurse Practitioner 03/15/20 8:15 AM

## 2020-03-18 ENCOUNTER — Encounter: Payer: Self-pay | Admitting: Podiatry

## 2020-03-18 NOTE — Progress Notes (Signed)
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 Clinical nursing staff has updated patient's PMH/PSHx, current medication list, and drug allergies/intolerances to ensure comprehensive history available to assist in medical decision making as it pertains to the aforementioned surgical procedure and anticipated anesthetic course.   Clinical Discussion:  Kayla Stout is a 69 y.o. female who is submitted for pre-surgical anesthesia review and clearance prior to her undergoing the above procedure. Patient is a Former Smoker (5 pack years; quit 03/2011). Pertinent PMH includes: CAD, STEMI (2007), dysrhythmia, HTN, COPD, asthma, GERD (on daily PPI), peptic ulcer disease, hepatitis C (s/p Harvoni treatment), leukocytosis, RLS, depression, anxiety (on BZO)  Patient is followed by cardiology Lady Gary, MD). She was last seen in the cardiology clinic on 03/14/2020; notes reviewed.  At the time of her clinic visit, patient denied any chest pain, shortness of breath, PND, orthopnea, palpitations, peripheral edema, vertiginous symptoms, or presyncope/syncope.  Patient status post STEMI in 2007 that was felt to be partially precipitated by her cocaine use.  Patient  underwent PCI at [facility], however following multiple attempts at revascularization, efforts were unsuccessful.  Balloon angioplasty did not change distal flow.  She was then treated with intracoronary nitroglycerin and adenosine, each with balloon inflation with medication administration, however no significant reflow was achieved.  Given her LVEDP at the time, and intra-aortic balloon pump (IABP) was placed to augment perfusion. IABP was milligrams removed the following day.  Patient was ultimately discharged from [facility] without further intervention with plans for medical treatment.  Patient underwent myocardial perfusion imaging on 02/29/2020 that revealed a mildly reduced left ventricular systolic function (LVEF 45 to 62%) and evidence of prior MI with evidence of an irreversible fixed defect.  Last TTE in 2019 revealed mildly reduced left ventricular function with an estimated EF of 40% and no valvular insufficiencies (see full interpretation of cardiovascular testing below).  Hypertension well controlled on ACEi and beta-blocker therapy.  Patient has a history of intolerance to atorvastatin.  Benefits in the setting of her known ASCVD were discussed.  Patient agreed to consider rosuvastatin at follow-up visit.  Patient scheduled to follow-up with outpatient cardiology in 3 months.  Patient scheduled for podiatric surgery on 03/22/2020 with Dr. Rosetta Posner.  Given patient's past medical history significant for cardiopulmonary issues, presurgical cardiac clearance was sought by the PAT team.  Per cardiology, "this patient is optimized to proceed with planned procedure from a cardiac standpoint is considered to be a MODERATE risk based on history of CAD, COPD, and other comorbidities". This patient is on daily antiplatelet therapy. She has been instructed on recommendations for continuing her daily ASA dose throughout the perioperative period.  She reports previous  perioperative complications with anesthesia.   Patient has a (+) history of PONV. Additionally, patient advising that she developed a rash and breathing difficulties related to "some local anesthetic with lidocaine".  She underwent a general anesthetic course here (ASA III) in 02/2018 with no documented complications.   Vitals with BMI 02/17/2018 02/17/2018 02/17/2018  Height - - -  Weight - - -  BMI - - -  Systolic 109 113 989  Diastolic 65 69 87  Pulse 80 83 79    Providers/Specialists:   NOTE: Primary physician provider listed below. Patient may have been seen by APP or partner within same practice.   PROVIDER ROLE LAST Roanna Epley, DPM Podiatry (Surgeon) 12/27/2019  Danella Penton, MD Primary Care Provider 12/06/2019  Harold Hedge, MD Cardiology 03/14/2020   Allergies:  Bee pollen, Codeine, Peanut-containing drug products, Lidocaine, Novocain [procaine], and Penicillins  Current Home Medications:   No current facility-administered medications for this encounter.   Marland Kitchen ALPRAZolam (XANAX) 1 MG tablet  . ANORO ELLIPTA 62.5-25 MCG/INH AEPB  . aspirin 81 MG tablet  . cetirizine (ZYRTEC) 10 MG tablet  . EPINEPHrine (EPIPEN 2-PAK) 0.3 mg/0.3 mL IJ SOAJ injection  . etodolac (LODINE) 400 MG tablet  . lisinopril (PRINIVIL,ZESTRIL) 5 MG tablet  . metoprolol (TOPROL-XL) 50 MG 24 hr tablet  . pantoprazole (PROTONIX) 40 MG tablet  . Travoprost, BAK Free, (TRAVATAN) 0.004 % SOLN ophthalmic solution  . vitamin B-12 (CYANOCOBALAMIN) 1000 MCG tablet  . vitamin E (VITAMIN E) 180 MG (400 UNITS) capsule  . albuterol (PROVENTIL) (2.5 MG/3ML) 0.083% nebulizer solution   History:   Past Medical History:  Diagnosis Date  . Acute MI (HCC) 2005   NO STENTS  . Anxiety   . Arthritis   . Asthma   . B12 deficiency   . Bronchitis   . Chronic back pain   . Complication of anesthesia    Rash and breathing difficulties r/t some local anesthetic-WITH LIDOCAINE  . COPD (chronic obstructive pulmonary disease) (HCC)   . Coronary artery  disease   . Costal chondritis   . DDD (degenerative disc disease), lumbar   . Depression   . Diverticulosis   . Dysrhythmia   . GERD (gastroesophageal reflux disease)    history of ulcers  . H/O: GI bleed   . Hepatitis C    TREATED WITH HARVONI  . History of kidney stones    H/O  . HOH (hard of hearing)   . Hx of ectopic pregnancy   . Hypertension   . Liver cirrhosis secondary to NASH (HCC)   . Lumbar herniated disc   . PAD (peripheral artery disease) (HCC)   . Peptic ulcer disease   . Pneumonia 21194  . PONV (postoperative nausea and vomiting)   . RLS (restless legs syndrome)    Past Surgical History:  Procedure Laterality Date  . APPENDECTOMY    . CARDIAC CATHETERIZATION  2006  . CATARACT EXTRACTION W/PHACO Right 10/30/2014   Procedure: CATARACT EXTRACTION PHACO AND INTRAOCULAR LENS PLACEMENT (IOC);  Surgeon: Galen Manila, MD;  Location: [facility] ORS;  Service: Ophthalmology;  Laterality: Right;  [place] 00:34 AP% 18.5 CDE 6.36  fluid pack lot# [id number]  . CATARACT EXTRACTION W/PHACO Left 11/13/2014   Procedure: CATARACT EXTRACTION PHACO AND INTRAOCULAR LENS PLACEMENT (IOC);  Surgeon: Galen Manila, MD;  Location: [facility] ORS;  Service: Ophthalmology;  Laterality: Left;  [place] 01:04 AP% 11.4 CDE 7.39 fluid pack lot # [id number]  . CHOLECYSTECTOMY  1980  .  COLONOSCOPY    . CORONARY ANGIOPLASTY    . CYSTOSCOPY W/ LITHOLAPAXY / EHL  1992  . ECTOPIC PREGNANCY SURGERY  1982  . ESOPHAGOGASTRODUODENOSCOPY (EGD) WITH PROPOFOL N/A 02/17/2018   Procedure: ESOPHAGOGASTRODUODENOSCOPY (EGD) WITH PROPOFOL;  Surgeon: Christena Deem, MD;  Location: [facility] ENDOSCOPY;  Service: Endoscopy;  Laterality: N/A;  . kidney stones    . OVARIAN CYST REMOVAL    . TUBAL LIGATION    . TUMOR EXCISION  1995   vocal cords   No family history on file. Social History   Tobacco Use  . Smoking status: Former Smoker    Packs/day: 0.25    Years: 20.00    Pack years: 5.00    Types: Cigarettes    Quit  date: 03/15/2011    Years since quitting: 9.0  . Smokeless tobacco: Never Used  Vaping Use  . Vaping Use: Never used  Substance Use Topics  . Alcohol use: No  . Drug use: No    Comment: History of caocaine abuse    Pertinent Clinical Results:  LABS: Labs reviewed: Acceptable for surgery.        ECG: Date: 02/19/2020 Rate: 74 bpm Rhythm: Sinus rhythm with first-degree AV block with occasional PVCs; RBBB Intervals: PR 214 ms. QRS 130 ms. QTc 444 ms. ST segment and T wave changes: No evidence of acute ST segment elevation or depression Comparison: Similar to previous tracing obtained on 02/14/2019 NOTE: Tracing obtained at [facility]; unable for review. Above based on cardiologist's interpretation.    IMAGING / PROCEDURES: LEXISCAN done on 02/29/2020 1. LVEF mildly decreased at 45-54% 2. Blood pressure demonstrated normal response to exercise 3. Perfusion defect present in the apical, anterior, and apex locations consistent with patient's prior myocardial infarction 4. The left ventricular cavity size is normal 5. This is an intermediate risk study  ECHOCARDIOGRAM done on 07/05/2017 1. LVEF 40% 2. Moderate LV systolic dysfunction  3. Normal RV systolic function 4. Trivial MR, TR, PR; no AR 5. No valvular stenosis 6. Regionally impaired contraction  Impression and Plan:  IZETTA SAKAMOTO has been referred for pre-anesthesia review and clearance prior to her undergoing the planned anesthetic and procedural courses. Available labs, pertinent testing, and imaging results were personally reviewed by me. This patient has been appropriately cleared by cardiology with a moderate risk stratification.   Based on clinical review performed today (03/18/20), barring any significant acute changes in the patient's overall condition, it is anticipated that she will be able to proceed with the planned surgical intervention. Any acute changes in clinical condition may necessitate her  procedure being postponed and/or cancelled. Pre-surgical instructions were reviewed with the patient during her PAT appointment and questions were fielded by PAT clinical staff.  Quentin Mulling, MSN, APRN, FNP-C, CEN [facility]  Peri-operative Services Nurse Practitioner Phone: [phone] 03/18/20 2:31 PM  NOTE: This note has been prepared using Dragon dictation software. Despite my best ability to proofread, there is always the potential that unintentional transcriptional errors may still occur from this process.

## 2020-03-20 ENCOUNTER — Other Ambulatory Visit: Payer: Self-pay

## 2020-03-20 ENCOUNTER — Other Ambulatory Visit
Admission: RE | Admit: 2020-03-20 | Discharge: 2020-03-20 | Disposition: A | Discharge status: Home / Self Care | Payer: Medicare Other | Source: Ambulatory Visit | Attending: Podiatry | Admitting: Podiatry

## 2020-03-20 DIAGNOSIS — Z20822 Contact with and (suspected) exposure to covid-19: Secondary | ICD-10-CM | POA: Diagnosis not present

## 2020-03-20 DIAGNOSIS — Z01818 Encounter for other preprocedural examination: Secondary | ICD-10-CM | POA: Diagnosis present

## 2020-03-20 LAB — SARS CORONAVIRUS 2 (TAT 6-24 HRS): SARS Coronavirus 2: NEGATIVE

## 2020-03-22 ENCOUNTER — Ambulatory Visit: Payer: Medicare Other

## 2020-03-22 ENCOUNTER — Other Ambulatory Visit: Payer: Self-pay

## 2020-03-22 ENCOUNTER — Ambulatory Visit
Admission: RE | Admit: 2020-03-22 | Discharge: 2020-03-22 | Disposition: A | Discharge status: Home / Self Care | Payer: Medicare Other | Attending: Podiatry | Admitting: Podiatry

## 2020-03-22 ENCOUNTER — Ambulatory Visit: Payer: Medicare Other | Admitting: Urgent Care

## 2020-03-22 ENCOUNTER — Encounter: Payer: Self-pay | Admitting: Podiatry

## 2020-03-22 ENCOUNTER — Encounter
Admission: RE | Disposition: A | Discharge status: Home / Self Care | Payer: Self-pay | Source: Home / Self Care | Attending: Podiatry

## 2020-03-22 DIAGNOSIS — X58XXXA Exposure to other specified factors, initial encounter: Secondary | ICD-10-CM | POA: Diagnosis not present

## 2020-03-22 DIAGNOSIS — Z419 Encounter for procedure for purposes other than remedying health state, unspecified: Secondary | ICD-10-CM

## 2020-03-22 DIAGNOSIS — M19071 Primary osteoarthritis, right ankle and foot: Secondary | ICD-10-CM | POA: Insufficient documentation

## 2020-03-22 DIAGNOSIS — M2141 Flat foot [pes planus] (acquired), right foot: Secondary | ICD-10-CM | POA: Insufficient documentation

## 2020-03-22 DIAGNOSIS — M2011 Hallux valgus (acquired), right foot: Secondary | ICD-10-CM | POA: Diagnosis not present

## 2020-03-22 DIAGNOSIS — S86311A Strain of muscle(s) and tendon(s) of peroneal muscle group at lower leg level, right leg, initial encounter: Secondary | ICD-10-CM | POA: Diagnosis not present

## 2020-03-22 DIAGNOSIS — M216X1 Other acquired deformities of right foot: Secondary | ICD-10-CM | POA: Insufficient documentation

## 2020-03-22 DIAGNOSIS — Z09 Encounter for follow-up examination after completed treatment for conditions other than malignant neoplasm: Secondary | ICD-10-CM

## 2020-03-22 HISTORY — PX: BUNIONECTOMY: SHX129

## 2020-03-22 HISTORY — PX: FOOT ARTHRODESIS: SHX1655

## 2020-03-22 HISTORY — DX: Other intervertebral disc degeneration, lumbar region: M51.36

## 2020-03-22 HISTORY — DX: Other intervertebral disc degeneration, lumbar region without mention of lumbar back pain or lower extremity pain: M51.369

## 2020-03-22 HISTORY — PX: ACHILLES TENDON SURGERY: SHX542

## 2020-03-22 HISTORY — DX: Unspecified cirrhosis of liver: K74.60

## 2020-03-22 HISTORY — DX: Peripheral vascular disease, unspecified: I73.9

## 2020-03-22 SURGERY — REPAIR, TENDON, ACHILLES
Anesthesia: General | Site: Toe | Laterality: Right

## 2020-03-22 MED ORDER — ONDANSETRON HCL 4 MG/2ML IJ SOLN
INTRAMUSCULAR | Status: AC
  Filled 2020-03-22: qty 2

## 2020-03-22 MED ORDER — FENTANYL CITRATE (PF) 100 MCG/2ML IJ SOLN
INTRAMUSCULAR | Status: AC
  Filled 2020-03-22: qty 2

## 2020-03-22 MED ORDER — DEXAMETHASONE SODIUM PHOSPHATE 10 MG/ML IJ SOLN
INTRAMUSCULAR | Status: AC
  Filled 2020-03-22: qty 1

## 2020-03-22 MED ORDER — CLINDAMYCIN PHOSPHATE 900 MG/50ML IV SOLN
INTRAVENOUS | Status: AC
  Filled 2020-03-22: qty 50

## 2020-03-22 MED ORDER — ONDANSETRON HCL 4 MG/2ML IJ SOLN
INTRAMUSCULAR | Status: DC | PRN
  Administered 2020-03-22: 4 mg via INTRAVENOUS

## 2020-03-22 MED ORDER — OXYCODONE-ACETAMINOPHEN 7.5-325 MG PO TABS
1.0000 | ORAL_TABLET | Freq: Four times a day (QID) | ORAL | 0 refills | Status: AC | PRN
Start: 2020-03-22 — End: 2020-03-29

## 2020-03-22 MED ORDER — PHENYLEPHRINE HCL-NACL 10-0.9 MG/250ML-% IV SOLN
INTRAVENOUS | Status: DC | PRN
  Administered 2020-03-22: 25 ug/min via INTRAVENOUS

## 2020-03-22 MED ORDER — SUGAMMADEX SODIUM 200 MG/2ML IV SOLN
INTRAVENOUS | Status: DC | PRN
  Administered 2020-03-22 (×2): 100 mg via INTRAVENOUS

## 2020-03-22 MED ORDER — MIDAZOLAM HCL 2 MG/2ML IJ SOLN
INTRAMUSCULAR | Status: AC
  Filled 2020-03-22: qty 2

## 2020-03-22 MED ORDER — POVIDONE-IODINE 10 % EX SWAB: 2.0000 "application " | Freq: Once | CUTANEOUS | Status: DC

## 2020-03-22 MED ORDER — LACTATED RINGERS IV SOLN: INTRAVENOUS | Status: DC

## 2020-03-22 MED ORDER — ROCURONIUM BROMIDE 10 MG/ML (PF) SYRINGE
PREFILLED_SYRINGE | INTRAVENOUS | Status: AC
  Filled 2020-03-22: qty 10

## 2020-03-22 MED ORDER — ACETAMINOPHEN 10 MG/ML IV SOLN
INTRAVENOUS | Status: DC | PRN
  Administered 2020-03-22: 1000 mg via INTRAVENOUS

## 2020-03-22 MED ORDER — CHLORHEXIDINE GLUCONATE 0.12 % MT SOLN
OROMUCOSAL | Status: AC
  Administered 2020-03-22: 15 mL via OROMUCOSAL
  Filled 2020-03-22: qty 15

## 2020-03-22 MED ORDER — PHENYLEPHRINE HCL (PRESSORS) 10 MG/ML IV SOLN
INTRAVENOUS | Status: DC | PRN
  Administered 2020-03-22 (×2): 100 ug via INTRAVENOUS

## 2020-03-22 MED ORDER — CLINDAMYCIN PHOSPHATE 900 MG/50ML IV SOLN
900.0000 mg | INTRAVENOUS | Status: AC
  Administered 2020-03-22: 900 mg via INTRAVENOUS

## 2020-03-22 MED ORDER — PROPOFOL 10 MG/ML IV BOLUS
INTRAVENOUS | Status: AC
  Filled 2020-03-22: qty 20

## 2020-03-22 MED ORDER — ACETAMINOPHEN 10 MG/ML IV SOLN
INTRAVENOUS | Status: AC
  Filled 2020-03-22: qty 100

## 2020-03-22 MED ORDER — BUPIVACAINE HCL (PF) 0.25 % IJ SOLN
INTRAMUSCULAR | Status: AC
  Filled 2020-03-22: qty 30

## 2020-03-22 MED ORDER — ROCURONIUM BROMIDE 100 MG/10ML IV SOLN
INTRAVENOUS | Status: DC | PRN
  Administered 2020-03-22: 50 mg via INTRAVENOUS
  Administered 2020-03-22: 20 mg via INTRAVENOUS

## 2020-03-22 MED ORDER — HEPARIN SODIUM (PORCINE) 10000 UNIT/ML IJ SOLN
INTRAMUSCULAR | Status: DC | PRN
  Administered 2020-03-22: 10000 [IU] via SUBCUTANEOUS

## 2020-03-22 MED ORDER — METOCLOPRAMIDE HCL 5 MG/ML IJ SOLN
INTRAMUSCULAR | Status: AC
  Administered 2020-03-22: 10 mg
  Filled 2020-03-22: qty 2

## 2020-03-22 MED ORDER — BUPIVACAINE HCL 0.25 % IJ SOLN
INTRAMUSCULAR | Status: DC | PRN
  Administered 2020-03-22: 10 mL

## 2020-03-22 MED ORDER — SULFAMETHOXAZOLE-TRIMETHOPRIM 800-160 MG PO TABS: 1.0000 | ORAL_TABLET | Freq: Two times a day (BID) | ORAL | 0 refills | Status: DC

## 2020-03-22 MED ORDER — ONDANSETRON HCL 4 MG PO TABS: 4.0000 mg | ORAL_TABLET | Freq: Three times a day (TID) | ORAL | 0 refills | Status: AC | PRN

## 2020-03-22 MED ORDER — LIDOCAINE HCL (PF) 2 % IJ SOLN
INTRAMUSCULAR | Status: AC
  Filled 2020-03-22: qty 5

## 2020-03-22 MED ORDER — ASPIRIN 81 MG PO TABS: 162.0000 mg | ORAL_TABLET | Freq: Two times a day (BID) | ORAL | 3 refills | Status: AC

## 2020-03-22 MED ORDER — ROPIVACAINE HCL 5 MG/ML IJ SOLN
INTRAMUSCULAR | Status: AC
  Filled 2020-03-22: qty 30

## 2020-03-22 MED ORDER — SUCCINYLCHOLINE CHLORIDE 200 MG/10ML IV SOSY
PREFILLED_SYRINGE | INTRAVENOUS | Status: AC
  Filled 2020-03-22: qty 10

## 2020-03-22 MED ORDER — ORAL CARE MOUTH RINSE: 15.0000 mL | Freq: Once | OROMUCOSAL | Status: AC

## 2020-03-22 MED ORDER — LIDOCAINE HCL (PF) 1 % IJ SOLN
INTRAMUSCULAR | Status: AC
  Filled 2020-03-22: qty 5

## 2020-03-22 MED ORDER — FENTANYL CITRATE (PF) 100 MCG/2ML IJ SOLN
INTRAMUSCULAR | Status: DC | PRN
  Administered 2020-03-22: 25 ug via INTRAVENOUS
  Administered 2020-03-22: 50 ug via INTRAVENOUS
  Administered 2020-03-22: 25 ug via INTRAVENOUS

## 2020-03-22 MED ORDER — ROPIVACAINE HCL 5 MG/ML IJ SOLN
INTRAMUSCULAR | Status: DC | PRN
  Administered 2020-03-22: 30 mL via PERINEURAL

## 2020-03-22 MED ORDER — METOCLOPRAMIDE HCL 5 MG/ML IJ SOLN: 10.0000 mg | Freq: Four times a day (QID) | INTRAMUSCULAR | Status: DC | PRN

## 2020-03-22 MED ORDER — LIDOCAINE HCL (CARDIAC) PF 100 MG/5ML IV SOSY
PREFILLED_SYRINGE | INTRAVENOUS | Status: DC | PRN
  Administered 2020-03-22: 100 mg via INTRAVENOUS

## 2020-03-22 MED ORDER — ONDANSETRON HCL 4 MG/2ML IJ SOLN
4.0000 mg | Freq: Once | INTRAMUSCULAR | Status: AC | PRN
  Administered 2020-03-22: 4 mg via INTRAVENOUS

## 2020-03-22 MED ORDER — BUPIVACAINE LIPOSOME 1.3 % IJ SUSP
INTRAMUSCULAR | Status: AC
  Filled 2020-03-22: qty 20

## 2020-03-22 MED ORDER — CHLORHEXIDINE GLUCONATE 0.12 % MT SOLN: 15.0000 mL | Freq: Once | OROMUCOSAL | Status: AC

## 2020-03-22 MED ORDER — FENTANYL CITRATE (PF) 100 MCG/2ML IJ SOLN
25.0000 ug | INTRAMUSCULAR | Status: DC | PRN
  Administered 2020-03-22 (×4): 25 ug via INTRAVENOUS

## 2020-03-22 MED ORDER — ASPIRIN 81 MG PO TABS
162.0000 mg | ORAL_TABLET | Freq: Two times a day (BID) | ORAL | 3 refills | Status: DC
Start: 2020-03-22 — End: 2020-03-22

## 2020-03-22 MED ORDER — MIDAZOLAM HCL 2 MG/2ML IJ SOLN
INTRAMUSCULAR | Status: DC | PRN
  Administered 2020-03-22 (×2): 1 mg via INTRAVENOUS

## 2020-03-22 MED ORDER — PROPOFOL 10 MG/ML IV BOLUS
INTRAVENOUS | Status: DC | PRN
  Administered 2020-03-22: 150 mg via INTRAVENOUS

## 2020-03-22 MED ORDER — DEXAMETHASONE SODIUM PHOSPHATE 10 MG/ML IJ SOLN
INTRAMUSCULAR | Status: DC | PRN
  Administered 2020-03-22: 5 mg via INTRAVENOUS

## 2020-03-22 MED ORDER — LIDOCAINE HCL (PF) 1 % IJ SOLN
INTRAMUSCULAR | Status: DC | PRN
  Administered 2020-03-22: 5 mL via SUBCUTANEOUS

## 2020-03-22 SURGICAL SUPPLY — 115 items
BIT DRILL 2 FENESTRATED (MISCELLANEOUS) IMPLANT
BIT DRILL 2.4X140 LONG SOLID (BIT) ×1 IMPLANT
BIT DRILL CANN SURG 2.9X140 (DRILL) IMPLANT
BIT DRILL CANNULATED 4.6 (BIT) ×2 IMPLANT
BIT DRILL CANNULTD 2.6 X 130MM (DRILL) IMPLANT
BIT DRILL CNTRSNK MONSTER 7.0 (DRILL) ×1 IMPLANT
BIT DRILL LNG 16X3.5XCANN (BIT) IMPLANT
BIT DRILLL 2 FENESTRATED (MISCELLANEOUS) ×4
BIT DRL LNG 16X3.5XCANN (BIT) ×3
BLADE MED AGGRESSIVE (BLADE) ×1 IMPLANT
BLADE OSC/SAGITTAL MD 5.5X18 (BLADE) ×1 IMPLANT
BLADE SURG 15 STRL LF DISP TIS (BLADE) IMPLANT
BLADE SURG 15 STRL SS (BLADE) ×16
BLADE SURG MINI STRL (BLADE) IMPLANT
BNDG CMPR 75X41 PLY HI ABS (GAUZE/BANDAGES/DRESSINGS) ×3
BNDG CMPR STD VLCR NS LF 5.8X4 (GAUZE/BANDAGES/DRESSINGS) ×6
BNDG CMPR STD VLCR NS LF 5.8X6 (GAUZE/BANDAGES/DRESSINGS) ×3
BNDG ELASTIC 4X5.8 VLCR NS LF (GAUZE/BANDAGES/DRESSINGS) ×8 IMPLANT
BNDG ELASTIC 6X5.8 VLCR NS LF (GAUZE/BANDAGES/DRESSINGS) ×4 IMPLANT
BNDG ESMARK 4X12 TAN STRL LF (GAUZE/BANDAGES/DRESSINGS) ×4 IMPLANT
BNDG GAUZE 4.5X4.1 6PLY STRL (MISCELLANEOUS) ×4 IMPLANT
BNDG STRETCH 4X75 STRL LF (GAUZE/BANDAGES/DRESSINGS) ×4 IMPLANT
BONE CANC CHIPS 20CC PCAN1/4 (Bone Implant) ×4 IMPLANT
BUR 4X45 EGG (BURR) ×1 IMPLANT
CANISTER SUCT 1200ML W/VALVE (MISCELLANEOUS) ×2 IMPLANT
CHIPS CANC BONE 20CC PCAN1/4 (Bone Implant) ×3 IMPLANT
COUNTERSICK 4.0 HEADED (MISCELLANEOUS) ×4
COUNTERSINK HEADLESS 5.5 (EXFIX) ×4
COVER LIGHT HANDLE STERIS (MISCELLANEOUS) ×8 IMPLANT
CUFF TOURN SGL QUICK 18X4 (TOURNIQUET CUFF) ×4 IMPLANT
CUFF TOURN SGL QUICK 30 (TOURNIQUET CUFF)
CUFF TOURN SGL QUICK 34 (TOURNIQUET CUFF) ×4
CUFF TRNQT CYL 30X4X21-28X (TOURNIQUET CUFF) IMPLANT
CUFF TRNQT CYL 34X4.125X (TOURNIQUET CUFF) IMPLANT
DRAPE FLUOR MINI C-ARM 54X84 (DRAPES) ×2 IMPLANT
DRAPE U-SHAPE 47X51 STRL (DRAPES) ×2 IMPLANT
DRILL CANN 3.5X160 (BIT) ×4
DRILL CANN SURG 2.9X140 (DRILL) ×4
DRILL CANNULATED 2.6 X 130MM (DRILL) ×4
DRILL COUNTERSINK MONSTER 7.0 (DRILL) ×4
DURAPREP 26ML APPLICATOR (WOUND CARE) ×4 IMPLANT
ELECT REM PT RETURN 9FT ADLT (ELECTROSURGICAL) ×4
ELECTRODE REM PT RTRN 9FT ADLT (ELECTROSURGICAL) ×3 IMPLANT
GAUZE 4X4 16PLY RFD (DISPOSABLE) ×1 IMPLANT
GAUZE SPONGE 4X4 12PLY STRL (GAUZE/BANDAGES/DRESSINGS) ×4 IMPLANT
GAUZE SPONGE 4X4 16PLY XRAY LF (GAUZE/BANDAGES/DRESSINGS) ×2 IMPLANT
GAUZE XEROFORM 1X8 LF (GAUZE/BANDAGES/DRESSINGS) ×4 IMPLANT
GLOVE BIO SURGEON STRL SZ7 (GLOVE) ×4 IMPLANT
GLOVE BIO SURGEON STRL SZ7.5 (GLOVE) ×4 IMPLANT
GLOVE BIOGEL PI IND STRL 7.0 (GLOVE) ×5 IMPLANT
GLOVE BIOGEL PI INDICATOR 7.0 (GLOVE) ×3
GOWN STRL REUS W/ TWL LRG LVL3 (GOWN DISPOSABLE) ×7 IMPLANT
GOWN STRL REUS W/TWL LRG LVL3 (GOWN DISPOSABLE) ×12
GRAFT BNE CANC CHIPS 1-8 20CC (Bone Implant) IMPLANT
GUIDE DEPTH MEASURE 4.5 (ORTHOPEDIC DISPOSABLE SUPPLIES) ×1 IMPLANT
HOLDER FOLEY CATH W/STRAP (MISCELLANEOUS) ×2 IMPLANT
K-WIRE DBL END TROCAR 6X.045 (WIRE)
K-WIRE FIX 1.4X150 (WIRE) ×4
K-WIRE SINGLE TROCAR 2.3X230 (WIRE) ×8
K-WIRE SMOOTH 1.6X150MM (WIRE) ×8
K-WIRE SMOOTH 2.3X150 SGL TIP (WIRE) ×4
K-WIRE SMOOTH TROCAR 2.0X150 (WIRE) ×12
K-WIRE SNGL END 1.2X150 (MISCELLANEOUS) ×12
KIT ASP BONE MRW 11G (KITS) ×1 IMPLANT
KIT TURNOVER KIT A (KITS) ×4 IMPLANT
KWIRE DBL END TROCAR 6X.045 (WIRE) ×3 IMPLANT
KWIRE FIX 1.4X150 (WIRE) ×1 IMPLANT
KWIRE SINGLE TROCAR 2.3X230 (WIRE) IMPLANT
KWIRE SMOOTH 1.6X150MM (WIRE) IMPLANT
KWIRE SMOOTH 2.3X150 SGL TIP (WIRE) ×1 IMPLANT
KWIRE SMOOTH TROCAR 2.0X150 (WIRE) ×3 IMPLANT
KWIRE SNGL END 1.2X150 (MISCELLANEOUS) IMPLANT
MANIFOLD NEPTUNE II (INSTRUMENTS) ×4 IMPLANT
NS IRRIG 500ML POUR BTL (IV SOLUTION) ×6 IMPLANT
PACK EXTREMITY (MISCELLANEOUS) ×4 IMPLANT
PADDING CAST BLEND 4X4 NS (MISCELLANEOUS) ×16 IMPLANT
PENCIL SMOKE EVACUATOR (MISCELLANEOUS) ×4 IMPLANT
PLATE LAPIDUS 3H STD RT (Plate) ×2 IMPLANT
RASP SM TEAR CROSS CUT (RASP) ×2 IMPLANT
SCREW CANN FT 4.5X32 (Screw) ×1 IMPLANT
SCREW CANN HEADLESS 5.5X75 (Screw) ×2 IMPLANT
SCREW CANN ST 7.0X76 (Screw) ×1 IMPLANT
SCREW CANNULATED 4.0X36S (Screw) ×1 IMPLANT
SCREW COUNTERSINK 4.0 HEADED (MISCELLANEOUS) IMPLANT
SCREW COUNTERSINK HEADLESS 5.5 (EXFIX) ×2 IMPLANT
SCREW LOCK PLATE R3 3.5X14 (Screw) ×2 IMPLANT
SCREW LOCK PLATE R3 3.5X16 (Screw) ×1 IMPLANT
SCREW NON LOCK 3.5X18 (Screw) ×2 IMPLANT
SLEEVE PROTECTION STRL DISP (MISCELLANEOUS) ×3 IMPLANT
SPLINT CAST 1 STEP 4X30 (MISCELLANEOUS) ×4 IMPLANT
STAPLE SYS JAWS 20X20X20 (Staple) ×4 IMPLANT
STAPLE SYS JAWS 8X8X8 (Staple) ×2 IMPLANT
STIMULATOR BONE (ORTHOPEDIC SUPPLIES) ×4
STIMULATOR BONE GROWTH EMG EXT (ORTHOPEDIC SUPPLIES) ×2 IMPLANT
STOCKINETTE IMPERVIOUS 9X36 MD (GAUZE/BANDAGES/DRESSINGS) ×4 IMPLANT
SUT ETHILON 3-0 FS-10 30 BLK (SUTURE) ×8
SUT ETHILON 4-0 (SUTURE)
SUT ETHILON 4-0 FS2 18XMFL BLK (SUTURE)
SUT ETHILON NAB PS2 4-0 18IN (SUTURE) ×4 IMPLANT
SUT MNCRL+ 5-0 UNDYED PC-3 (SUTURE) IMPLANT
SUT MONOCRYL 5-0 (SUTURE)
SUT VIC AB 0 CT1 27 (SUTURE)
SUT VIC AB 0 CT1 27XCR 8 STRN (SUTURE) IMPLANT
SUT VIC AB 2-0 SH 27 (SUTURE)
SUT VIC AB 2-0 SH 27XBRD (SUTURE) IMPLANT
SUT VIC AB 3-0 SH 27 (SUTURE) ×8
SUT VIC AB 3-0 SH 27X BRD (SUTURE) IMPLANT
SUT VIC AB 4-0 FS2 27 (SUTURE) ×4 IMPLANT
SUTURE EHLN 3-0 FS-10 30 BLK (SUTURE) ×3 IMPLANT
SUTURE ETHLN 4-0 FS2 18XMF BLK (SUTURE) IMPLANT
SYR 10ML LL (SYRINGE) ×2 IMPLANT
TRAY FOLEY SLVR 16FR LF STAT (SET/KITS/TRAYS/PACK) ×2 IMPLANT
WEDGE LAPIDUS 8MM (Bone Implant) ×1 IMPLANT
WIRE OLIVE SMOOTH 1.4MMX60MM (WIRE) ×4 IMPLANT
bone marrow harvest needle ×1 IMPLANT

## 2020-03-22 NOTE — Discharge Instructions (Addendum)
[facility] SURGERY CENTER  POST OPERATIVE INSTRUCTIONS FOR DR. TROXLER, DR. Ether Griffins, AND [facility] P[facility]   1. Take your medication as prescribed.  Pain medication should be taken only as needed.  You may take ibuprofen or tylenol as well in addition to the pain medication and take it between pain medication doses for further relief.  2. Keep the dressing clean, dry and intact.  Remain nonweightbearing at all times to the right foot.  3. Keep your foot elevated above the heart level for the first 48 hours and beyond.  4. Walking to the bathroom and brief periods of walking are acceptable, unless we have instructed you to be non-weight bearing.  5. Always wear your post-op shoe when walking.  Always use your crutches if you are to be non-weight bearing.  6. Do not take a shower. Baths are permissible as long as the foot is kept out of the water.   7. Every hour you are awake:  - Bend your knee 15 times. - Massage calf 15 times  8. Call [facility] [phone]) if any of the following problems occur: - You develop a temperature or fever. - The bandage becomes saturated with blood. - Medication does not stop your pain. - Injury of the foot occurs. - Any symptoms of infection including redness, odor, or red streaks running from wound.   Continue use of bone stimulator as written by company.  Call [employer] representative if you have questions on usage or application.  AMBULATORY SURGERY  DISCHARGE INSTRUCTIONS   1) The drugs that you were given will stay in your system until tomorrow so for the next 24 hours you should not:  A) Drive an automobile B) Make any legal decisions C) Drink any alcoholic beverage   2) You may resume regular meals tomorrow.  Today it is better to start with liquids and gradually work up to solid foods.  You may eat anything you prefer, but it is better to start with liquids, then soup and  crackers, and gradually work up to solid foods.   3) Please notify your doctor immediately if you have any unusual bleeding, trouble breathing, redness and pain at the surgery site, drainage, fever, or pain not relieved by medication.    4) Additional Instructions:        Please contact your physician with any problems or Same Day Surgery at [phone], Monday through Friday 6 am to 4 pm, or [place] at [facility] number at [phone].AMBULATORY SURGERY  DISCHARGE INSTRUCTIONS   5) The drugs that you were given will stay in your system until tomorrow so for the next 24 hours you should not:  D) Drive an automobile E) Make any legal decisions F) Drink any alcoholic beverage   6) You may resume regular meals tomorrow.  Today it is better to start with liquids and gradually work up to solid foods.  You may eat anything you prefer, but it is better to start with liquids, then soup and crackers, and gradually work up to solid foods.   7) Please notify your doctor immediately if you have any unusual bleeding, trouble breathing, redness and pain at the surgery site, drainage, fever, or pain not relieved by medication.    8) Additional Instructions:        Please contact your physician with any problems or Same Day Surgery at [phone], Monday through Friday 6 am to 4 pm, or [place] at [facility]  number at [phone].

## 2020-03-22 NOTE — Op Note (Addendum)
PODIATRY / FOOT AND ANKLE SURGERY OPERATIVE REPORT    SURGEON: Caroline More, DPM  ASSISTANT:  Samara Deist, DPM  PRE-OPERATIVE DIAGNOSIS: All right foot/ankle 1. Equinus  2. Pes planus 3. 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 Peroneus brevis split thickness tear  POST-OPERATIVE DIAGNOSIS: same  PROCEDURE(S): All right side 1. TAL 2. STJ fusion 3. TNJ fusion 4. BMA harvest with application of allograft chips 5. Lapidus bunionectomy with akin osteotomy 6. Application of bone stimulator 7. Use of intraop fluoro 8. Application of posterior splint  HEMOSTASIS: Right thigh tourniquet  ANESTHESIA: general  ESTIMATED BLOOD LOSS: 50 cc  FINDING(S): 1.  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 female who presents with a painful arthritic flatfoot deformity to both the right foot as well as large painful bunion deformity.  Pt has exhausted all conservative measures and after benefits and complications were discussed, pt has elected for procedure listed above.  DESCRIPTION: After obtaining full informed written consent, the patient was brought back to the operating room and placed supine upon the operating table.  The patient received IV antibiotics prior to induction.  After obtaining adequate anesthesia, the patient was prepped and draped in the standard fashion.  Attention was directed to the R lateral heel.  The BMAC cannula and trochar was placed into the lateral body of the calcaneus to the midbody level.  The obturator was removed. The heparin mixture in the 10cc syringe was then used to aspirate 10cc of BMA from the central body of the calcaneus.  The BMA was spun down to concentrate the solution and it was added to cancellous bone chips.  The eshmarch bandage was used to exsanginuate the RLE and the thigh tourniquet was inflated.  Attention was directed to the posterior aspect of  the right heel and achilles tendon.  3 small stab incisions were made 3-4cm apart with 2 being placed medially and the central incision begin placed laterally.  The tendoachilles lengthening was performed through those incisions and the foot appeared to have 15 degrees ankle dorsiflexion after.  The incisions were reapproximated and well coapted with skin staples.  Attention was then directed to the R lateral foot/ankle area where an incision was made from the distal fibula to the dorsal CCJ area.  The incision was deepened through the subQ with retracting all vital N/V structures and all venous contributories were cauterized as needed.  An incision was then made into the STJ capsular tissue and a portion of the EDB was reflected dorsally to obtain access in the sinus tarsi.  The peroneal tendon sheath was also opened and the tendons were visualized.  A tear was noted in the PB tendon about the inferior aspect of the fibula.  This tear was debridement freshening up the edges of the tendon and the tendon was retubularized with 3-0 vicryl in a running, interconnected stitch.    The STJ was then visualized and the interosseus ligament was transected.  An osteotome was used to open the joint up further.  There was notable articular cartilage damage to the posterior facet.  The pin distractor was then placed and the articular cartilage that remained was removed with combination of osteotome, currette, burr.  The surgical site was flushed with copious amounts of NSS.  A 2.0 drill bit was used to fenestrate the joint surface.  No cartilage remained at the anterior, posterior, and  middle facets.  The cancellous bone chips mixed with BMAC were then placed in the joint site and the pin distractor was removed,  The talus appeared to mobilize pretty easily at this point as well as the calcaneus for correction.  Another incision was then made from the medial mal to the medial cuneiform medially.  The incision was deepened  through the subQ with retracting all vital N/V structures and all venous contributories were cauterized as needed.  A periosteal and capsular incision was then made along the course of the incision and the periosteal and capsular tissue was reflected thereby exposing the TNJ.  There appeared to be a fair amount of articular cartilage damage to the talar head.  The pin distractor was then placed and the articular cartilage that remained was removed with combination of osteotome, currette, burr.  The surgical site was flushed with copious amounts of NSS.  A 2.0 drill bit was used to fenestrate the joint surface.  No cartilage remained.  The cancellous bone chips mixed with BMAC were then placed in the joint site and the pin distractor was removed.  The TNJ was easily mobilized at this time for manipulation.  The TNJ was then temp pinned with a temp wire while holding the TNJ in a reduced position and it appeared to have 100% coverage by the navicular and the forefoot was held in a rectus position.   The STJ was then held in a neutral position hold the heel in nearly neutral to slight valgus position.  A guidewire for a 7.0 partially threaded paragon28 headless screw was then placed from the posterioplantar calc, across the STJ, and into the posterior facet of the talus.  A guidewire for a 5.5 partially threaded paragon28 headless screw was then placed from the posterioplantar calc, across the STJ, and into the posterior facet of the talus parallel to the first wire.  This was done under fluoro.  Using AO principles and techniques the 7.0 and 5.5 screws were placed with excellent compression and seating. The STJ appeared to be very well compressed and stable and the relatively rectus position was obtained clinically and radiographically.  The guide wires were removed.  Attention was directed back to the TNJ.  The joint still appeared to be well reduced with good compression and apposition noted with the temp wire  in place.  At this time using standard AO techniques 2x paragon28 5m bone staples were used at the dorsal and medial aspects of the joint.  Excellent compression was obtained under fluoro guidance.  A small stab incision was made around the nav tuber and a 4.5 screw guidewire was placed from the nav tuber across the TNJ and into the lateral talus.  Using standard AO techniques the 4.54mpartially threaded screw was placed.  The TNJ appeared to be compressed well and very stable with good bony apposition.  The tourniquet was released at this time and a prompt hyperemic response was noted.  Hemostasis was achieved with cauterization and compression.  The deep capsular and periosteal structures were reapproximated with 3-0 vicryl.  The subQ was reapproximated and well coapted along with the peroneal tendon sheath with 3-0 vicryl and 4-0 monocryl.  The skin was reapproximated and well coapted with a combination of skin staples and 3-0 nylon.  An eshmarch bandage was used to exsanginuate the RLE and the thigh tourniquet was reinflated.   Attention was directed to the 1st TMTJ, 1st MTPJ, and proximal phalanx of the hallux where a  long incision was made medial to the EHL tendon along that course dorsally.  The incision was deepened through the subQ with retracting all vital N/V structures and all venous contributories were cauterized as needed.  A periosteal and capsular incision was made at the 1st TMTJ, 1st MTPJ, and proximal phalanx were the tissues were reflected medially and laterally thereby exposing the 1st TMTJ, 1st MTPJ, and proximal phlanax base and midshaft. Attention was directed to the 1st interspace lateral to the 1st MTPJ where a lateral release was performed of the joint releasing the combined tendon of the adductor hallucis, lateral capsular ligaments, and EHB tendon. The articular cartilage that remained at the 1st TMTJ was removed with combination of osteotome, currette, burr.  The surgical site  was flushed with copious amounts of NSS.  A 2.0 drill bit was used to fenestrate the joint surface.  No cartilage remained.  A pin was then placed in the medial 1st met base for derotation of the 1st met.  The 1st met was held in a reduced position but appeared to be a fair amount shorter than the 2nd met.  At this time a paragon28 56m bone wedge was placed at the operative site.  The reduction was then held again and notable improvement in IM angle, HAA, and sesamoid position was seen.  A guidewire for a 4.0 cannulated partially threaded paragon28 screw was placed from the dorsal aspect of the 1st met base across the 1st TMTJ and into the plantar aspect of the medial cuneiform.  Position was checked under fluoro.  Reduction was still maintained.  Using standard AO techniques a 4.0 x 332mscrew was placed with excellent compression noted.  A paragon28 lapidus 4 hole plate was then placed using standard AO techniques with 3x 3.5 lockings screws and 1x 3.5 nonlocking screw under fluoro guidance.  Appeared to fit very well and the 1st TMTJ appeared well compressed and stable.  The medial eminence was resected and past from the operative site.  An akin osteotomy was then performed at the midshaft of the proximal phalanx of the hallux removing a 32m76mone wedge to correct the HI angle.  A 8mm107mragon28 bone staple was placed with excellent compression noted under fluoro guidance.  The surgical site was flushed with copious amount of NSS.   A medial capsularrhaphy was performed to the 1st MTPJ for slight correction of mild residual HAA.  The capsular and periosteal tissue was reapproximated and well coapted with 3-0 vicryl.  The subQ was repaired and reapproximated with 3-0 vicryl and 4-0 monocryl.  The skin was closed with 3-0 nylon in a combo of simple and horizontral mattress stitching.  Final images were taken and saved in chart.  10cc of 0.25% marcaine was injected about the saphenous nerve distribution at the level  of the ankle and the tibial nerve at the medial ankle.  A postop dressing was applied after the thigh tourniquet was released and a prompt hyperemic response was noted to all digits consisting of xeroform to all incision sites, 4x4, kerlix, webroll, posterior splint, and ace wrap.  External bone stimulator was placed with the appropriate setting.  Pt tolerated the procedure and anesthesia well and was transferred to PACU in stable/good condition.  Anesthesia performed a postop popliteal block per documentation.  Pt will be discharged home with the appropriate orders and postop medications.  To f/u in clinic in 1 week for re-evaluation and dressing change.  Pt given instructions on usage of bone  stim.  COMPLICATIONS: None  CONDITION: Good, stable  Caroline More, DPM

## 2020-03-22 NOTE — H&P (Signed)
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  The various methods of treatment have been discussed with the patient.  No guarantees were given.  After consideration of risks, benefits and other options for treatment, the patient has consented to surgery.  I have reviewed the patients' chart and labs.    PROCEDURE: ALL RIGHT FOOT 1. TENDOACHILLES LENGTHENING 2. SUBTALAR JOINT FUSION 3. MEDIAL COLUMN FUSION INVOLVING TNJ, POSSIBLE NAVICULOCUNEIFORM JOINT, AND 1ST TMTJ 4. LAPIDUS BUNIONECTOMY WITH POSSIBLE AKIN OSTEOTOMY  A history and physical examination was performed in my office.  The patient was reexamined.  There have been no changes to this history and physical examination.  Discussed codeine allergy, pt gets some n/v only.  Pt ok receiving codeine medications for pain relief after surgery as long as she receives something for the n/v.  Pt agreeable.  Rosetta Posner, DPM

## 2020-03-22 NOTE — Anesthesia Procedure Notes (Signed)
Anesthesia Regional Block: Popliteal block   Pre-Anesthetic Checklist: ,, timeout performed, Correct Patient, Correct Site, Correct Laterality, Correct Procedure, Correct Position, site marked, Risks and benefits discussed,  Surgical consent,  Pre-op evaluation,  At surgeon's request and post-op pain management  Laterality: Lower and Right  Prep: chloraprep       Needles:  Injection technique: Single-shot  Needle Type: Echogenic Needle     Needle Length: 9cm  Needle Gauge: 21     Additional Needles:   Procedures:,,,, ultrasound used (permanent image in chart),,,,  Narrative:  Start time: 03/22/2020 2:12 PM End time: 03/22/2020 2:17 PM Injection made incrementally with aspirations every 5 mL.  Performed by: Personally  Anesthesiologist: Lenard Simmer, MD  Additional Notes: Patient consented for risk and benefits of nerve block including but not limited to nerve damage, failed block, bleeding and infection.  Patient voiced understanding.  Functioning IV was confirmed and monitors were applied.  Timeout done prior to procedure and prior to any sedation being given to the patient.  Patient confirmed procedure site prior to any sedation given to the patient.  A 52mm 22ga Stimuplex needle was used. Sterile prep,hand hygiene and sterile gloves were used.  Minimal sedation used for procedure.  No paresthesia endorsed by patient during the procedure.  Negative aspiration and negative test dose prior to incremental administration of local anesthetic. The patient tolerated the procedure well with no immediate complications.

## 2020-03-22 NOTE — Transfer of Care (Signed)
Immediate Anesthesia Transfer of Care Note  Patient: Kayla Stout  Procedure(s) Performed: ACHILLES TENDON LENGTHENING (Right ) ARTHRODESIS FOOT- STJ RIGHT, NAVICULOCUNEFORM RIGHT (Right Foot) LAPIDUS BUNIONECTOMY (Right Toe)  Patient Location: PACU  Anesthesia Type:General  Level of Consciousness: awake  Airway & Oxygen Therapy: Patient connected to nasal cannula oxygen  Post-op Assessment: Report given to RN  Post vital signs: stable  Last Vitals:  Vitals Value Taken Time  BP 133/85 03/22/20 1250  Temp    Pulse 88 03/22/20 1252  Resp 23 03/22/20 1252  SpO2 93 % 03/22/20 1252  Vitals shown include unvalidated device data.  Last Pain:  Vitals:   03/22/20 0635  TempSrc: Tympanic  PainSc: 10-Worst pain ever         Complications: No complications documented.

## 2020-03-22 NOTE — Anesthesia Preprocedure Evaluation (Signed)
Anesthesia Evaluation  Patient identified by MRN, date of birth, ID band Patient awake    Reviewed: Allergy & Precautions, H&P , NPO status , Patient's Chart, lab work & pertinent test results  History of Anesthesia Complications (+) PONV and history of anesthetic complications  Airway Mallampati: III       Dental  (+) Partial Upper, Missing, Dental Advidsory Given, Poor Dentition   Pulmonary neg shortness of breath, asthma , neg sleep apnea, COPD, neg recent URI, former smoker,     + decreased breath sounds      Cardiovascular hypertension, (-) angina+ CAD, + Past MI, + Peripheral Vascular Disease and +CHF  (-) Cardiac Stents and (-) CABG + dysrhythmias (-) Valvular Problems/Murmurs Rhythm:Regular     Neuro/Psych PSYCHIATRIC DISORDERS Anxiety Depression negative neurological ROS     GI/Hepatic PUD, GERD  ,(+) Hepatitis -, C  Endo/Other  neg diabetesMorbid obesity  Renal/GU negative Renal ROS  negative genitourinary   Musculoskeletal  (+) Arthritis , Osteoarthritis,    Abdominal (+) + obese,   Peds negative pediatric ROS (+)  Hematology negative hematology ROS (+)   Anesthesia Other Findings Past Medical History: 6 years ago: Acute MI (HCC) No date: Anxiety No date: Arthritis No date: Asthma No date: B12 deficiency No date: Bronchitis No date: Chronic back pain No date: Complication of anesthesia     Comment:  Rash and breathing difficulties r/t some local               anesthetic No date: Coronary artery disease No date: Costal chondritis No date: Depression No date: Diverticulosis No date: Dysrhythmia No date: GERD (gastroesophageal reflux disease)     Comment:  history of ulcers No date: H/O: GI bleed No date: Hepatitis C No date: Hepatitis C infection No date: History of kidney stones No date: HOH (hard of hearing) No date: Hx of ectopic pregnancy No date: Hypertension No date: Lumbar herniated  disc No date: Peptic ulcer disease No date: Pneumonia No date: PONV (postoperative nausea and vomiting) No date: RLS (restless legs syndrome)   Reproductive/Obstetrics negative OB ROS                             Anesthesia Physical  Anesthesia Plan  ASA: III  Anesthesia Plan: General   Post-op Pain Management:    Induction: Intravenous  PONV Risk Score and Plan: 4 or greater and Ondansetron, Dexamethasone and Treatment may vary due to age or medical condition  Airway Management Planned: Oral ETT  Additional Equipment:   Intra-op Plan:   Post-operative Plan: Extubation in OR  Informed Consent: I have reviewed the patients History and Physical, chart, labs and discussed the procedure including the risks, benefits and alternatives for the proposed anesthesia with the patient or authorized representative who has indicated his/her understanding and acceptance.     Dental Advisory Given  Plan Discussed with: Anesthesiologist, CRNA and Surgeon  Anesthesia Plan Comments:         Anesthesia Quick Evaluation

## 2020-03-22 NOTE — Anesthesia Procedure Notes (Signed)
Procedure Name: Intubation Date/Time: 03/22/2020 7:43 AM Performed by: Zetta Bills, CRNA Pre-anesthesia Checklist: Patient identified, Emergency Drugs available, Suction available and Patient being monitored Patient Re-evaluated:Patient Re-evaluated prior to induction Oxygen Delivery Method: Circle system utilized Preoxygenation: Pre-oxygenation with 100% oxygen Induction Type: IV induction Ventilation: Mask ventilation without difficulty Laryngoscope Size: Mac and 3 Grade View: Grade I Tube type: Oral Tube size: 7.0 mm Number of attempts: 1 Airway Equipment and Method: Stylet Placement Confirmation: ETT inserted through vocal cords under direct vision,  positive ETCO2 and breath sounds checked- equal and bilateral Secured at: 21 cm Tube secured with: Tape Dental Injury: Teeth and Oropharynx as per pre-operative assessment

## 2020-03-25 NOTE — Anesthesia Postprocedure Evaluation (Signed)
Anesthesia Post Note  Patient: Kayla Stout  Procedure(s) Performed: ACHILLES TENDON LENGTHENING (Right ) ARTHRODESIS FOOT- STJ RIGHT, NAVICULOCUNEFORM RIGHT (Right Foot) LAPIDUS BUNIONECTOMY (Right Toe)  Patient location during evaluation: PACU Anesthesia Type: General Level of consciousness: awake and alert Pain management: pain level controlled Vital Signs Assessment: post-procedure vital signs reviewed and stable Respiratory status: spontaneous breathing, nonlabored ventilation, respiratory function stable and patient connected to nasal cannula oxygen Cardiovascular status: blood pressure returned to baseline and stable Postop Assessment: no apparent nausea or vomiting Anesthetic complications: no   No complications documented.   Last Vitals:  Vitals:   03/22/20 1548 03/22/20 1554  BP: (!) 106/43 (!) 103/54  Pulse: 71 69  Resp: 18   Temp: (!) 36.2 C   SpO2: 97% 99%    Last Pain:  Vitals:   03/25/20 0830  TempSrc:   PainSc: 2                  Lenard Simmer

## 2020-03-26 ENCOUNTER — Encounter: Payer: Self-pay | Admitting: Podiatry

## 2020-07-15 IMAGING — MR MR LUMBAR SPINE W/O CM
5 series · 31 of 48 positions shown · non-contrast
Comparison: None.

CLINICAL DATA: Chronic lower back pain

EXAM:
MRI LUMBAR SPINE WITHOUT CONTRAST
TECHNIQUE: Multiplanar, multisequence MR imaging of the lumbar spine was
performed. No intravenous contrast was administered.

[Series 5: T2 · sagittal · 4.0mm · 0.81mm/px · 7 of 17 slices shown (1 of 2)]
[im 1/17]
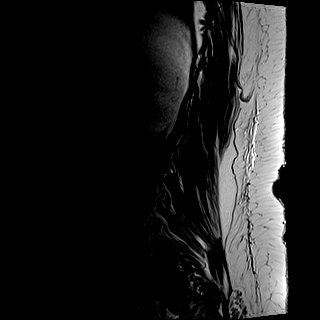
[im 3/17]
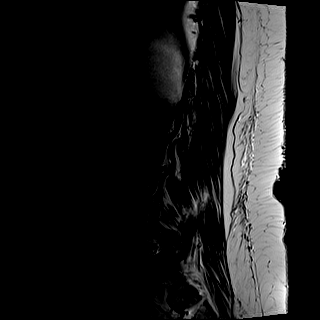
[im 6/17]
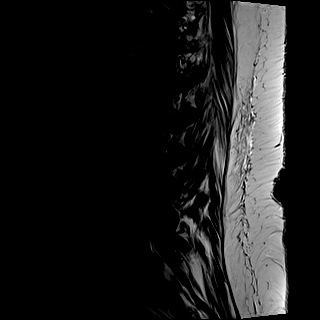
[im 9/17]
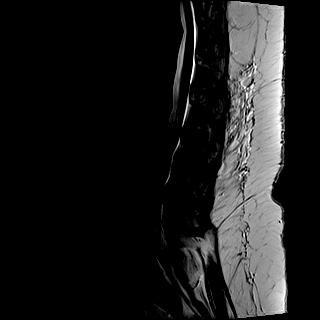
[im 11/17]
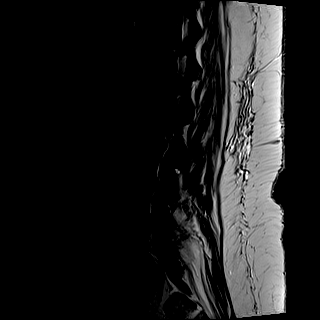
[im 14/17]
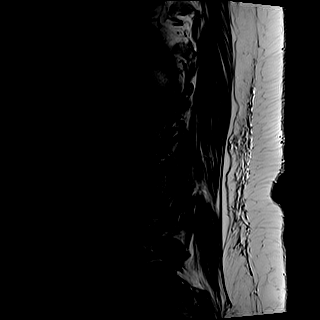
[im 17/17]
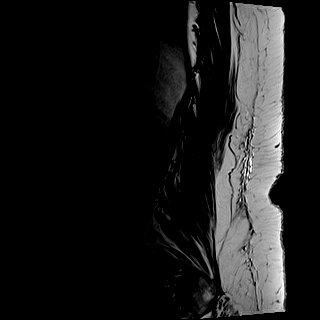

[Series 6: T1 · sagittal · 4.0mm · 0.81mm/px · 7 of 17 slices shown (1 of 2)]
[im 1/17]
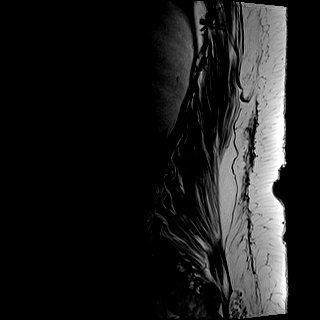
[im 3/17]
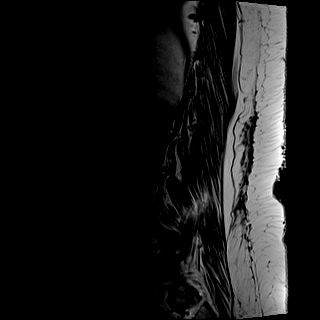
[im 6/17]
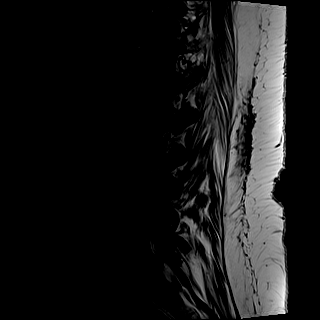
[im 9/17]
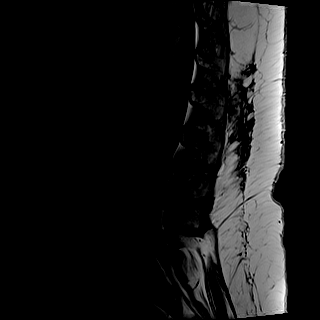
[im 11/17]
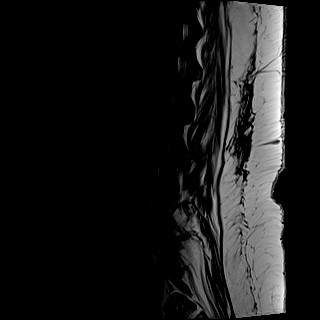
[im 14/17]
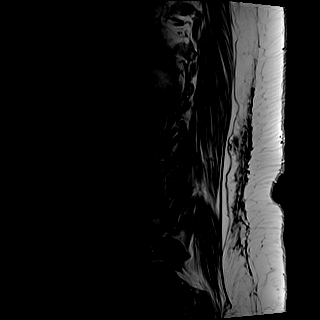
[im 17/17]
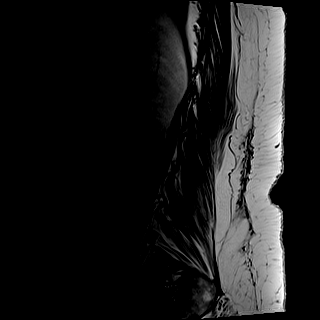

[Series 7: STIR · sagittal · 4.0mm · 0.41mm/px · 1 of 17 slices shown]
[im 1/17]
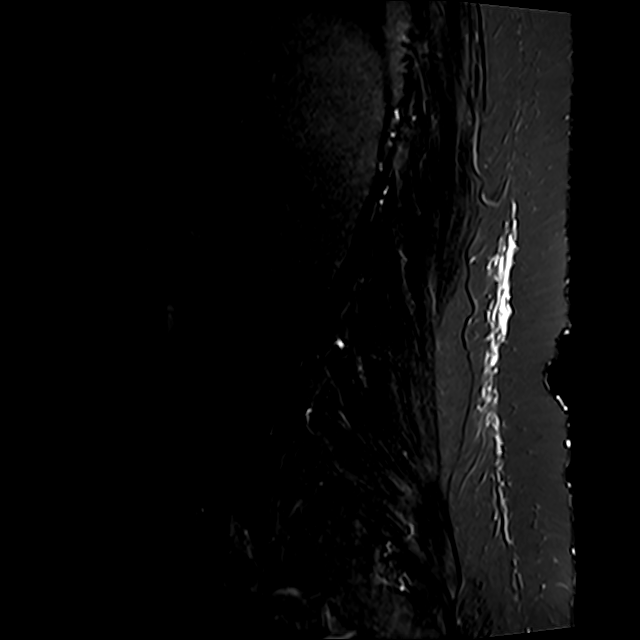

[Series 8: T2 · axial · 4.0mm · 0.78mm/px · z∈[-119,+89]mm · 8 of 38 slices shown (2 of 2)]
[im 1/38]
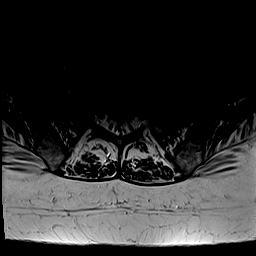
[im 6/38]
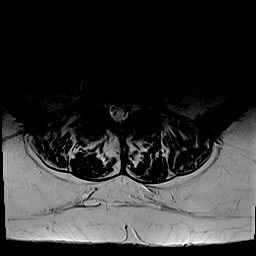
[im 12/38]
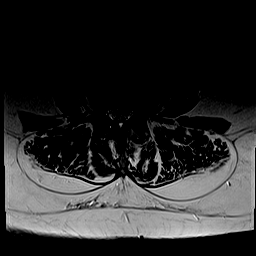
[im 18/38]
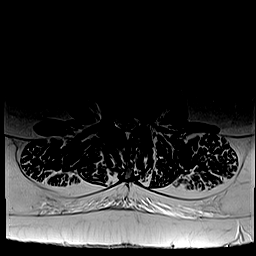
[im 20/38]
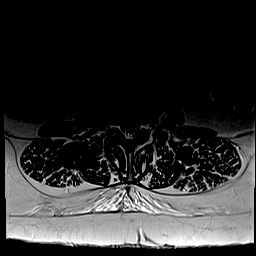
[im 26/38]
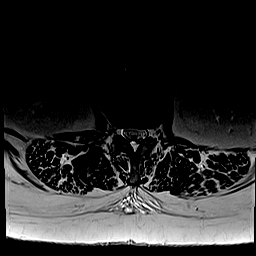
[im 32/38]
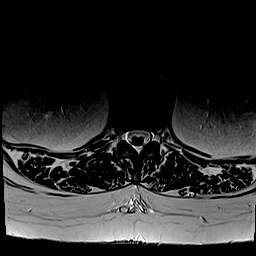
[im 38/38]
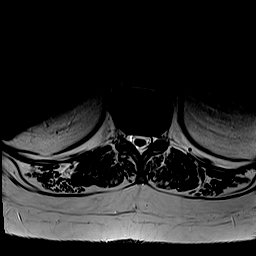

[Series 9: T1 · axial · 4.0mm · 0.39mm/px · z∈[-119,+89]mm · 8 of 38 slices shown (2 of 2)]
[im 1/38]
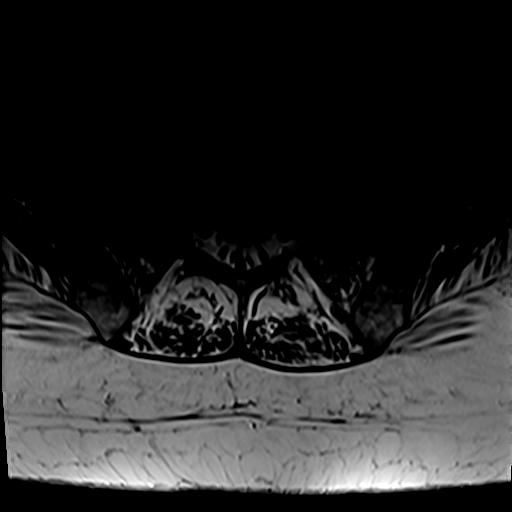
[im 6/38]
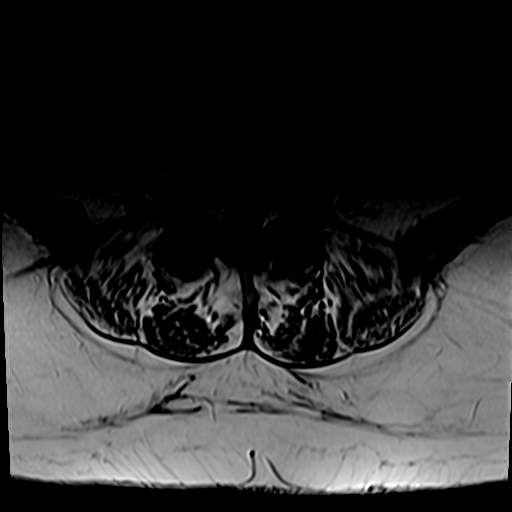
[im 12/38]
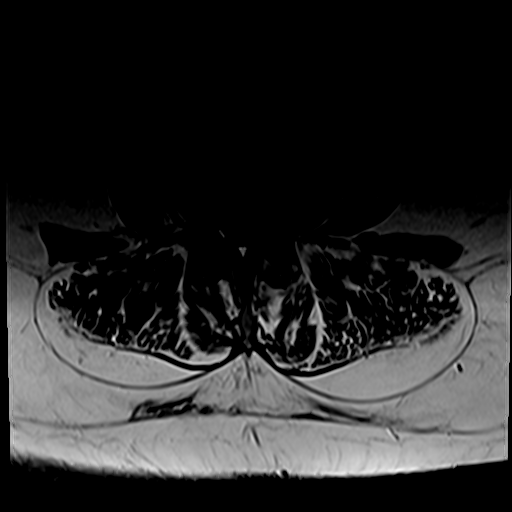
[im 18/38]
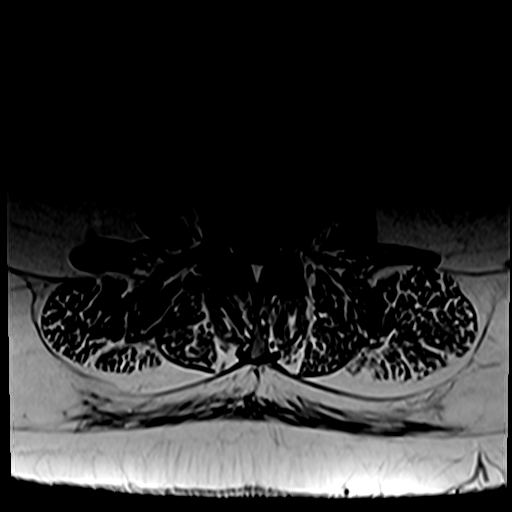
[im 20/38]
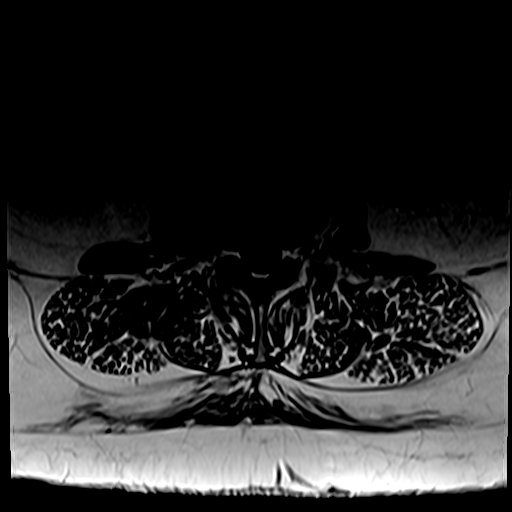
[im 26/38]
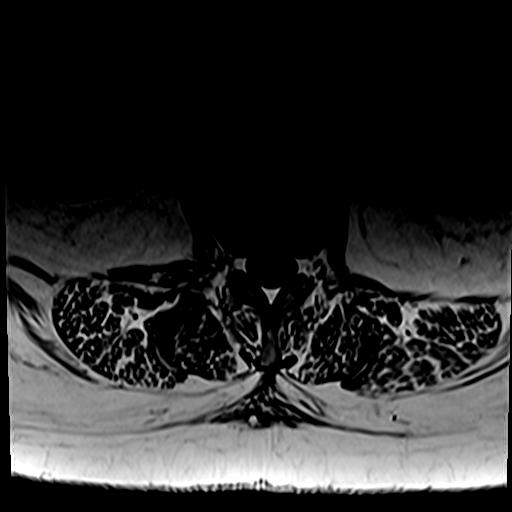
[im 32/38]
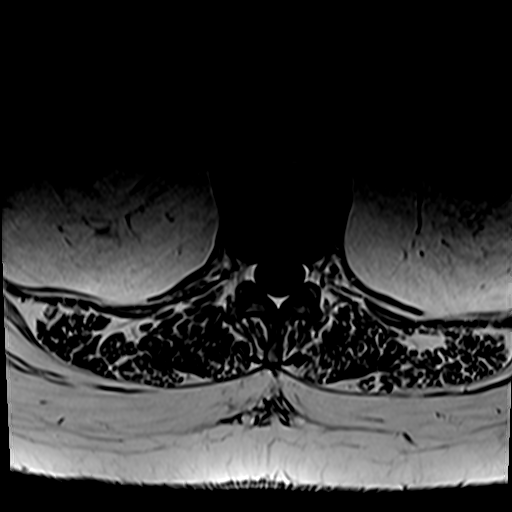
[im 38/38]
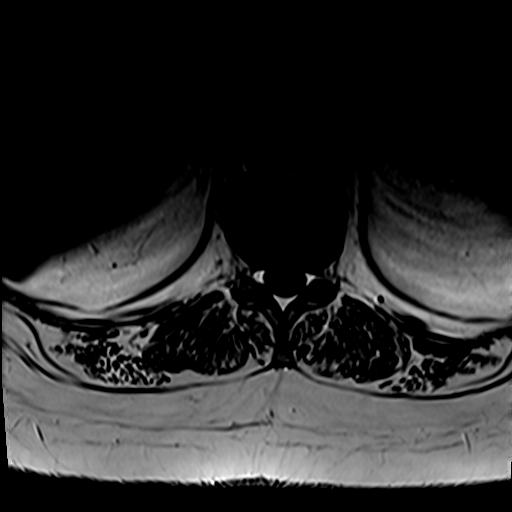

[31 of 48 positions shown; findings below may reference images not displayed]

FINDINGS: Segmentation: There are 5 non-rib bearing lumbar type vertebral
bodies with the last intervertebral disc space labeled as L5-S1.

Alignment:  There is a minimal retrolisthesis of L3 on L4.

Vertebrae: The vertebral body heights are well maintained. No
fracture, marrow edema,or pathologic marrow infiltration. Inferior
endplate reactive changes are seen at L2. There anterior osteophytes
seen throughout the lumbar spine. There is slight chronic anterior
wedge compression deformity of the T11 and T12 vertebral bodies.

Conus medullaris and cauda equina: Conus extends to the L1-2 level.
Conus and cauda equina appear normal.

Paraspinal and other soft tissues: T2 bright cystic lesion seen
within the left kidney. The paraspinal soft tissues and visualized
retroperitoneal structures are unremarkable. The sacroiliac joints
are intact.

Disc levels:

T12-L1:  No significant canal or neural foraminal narrowing.

L1-L2: There is a broad-based disc bulge and ligamentum flavum
hypertrophy which causes mild bilateral neural foraminal narrowing.

L2-L3: There is a broad-based disc bulge ligamentum flavum
hypertrophy and facet arthrosis which causes mild bilateral neural
foraminal narrowing. The central thecal sac is mildly effaced
measuring 7 mm in AP diameter.

L3-L4: There is a broad-based disc bulge with a right foraminal/far
lateral disc protrusion as well as facet arthrosis and ligamentum
flavum hypertrophy. There is severe bilateral neural foraminal
narrowing. The central thecal sac measures 5 mm in AP diameter.

L4-L5: There is a broad-based disc bulge with ligamentum flavum
hypertrophy and facet arthrosis. Severe bilateral neural foraminal
narrowing is seen. The central thecal sac measures 6 mm in AP
diameter.

L5-S1: There is a broad-based disc bulge with facet arthrosis and
ligamentum flavum hypertrophy. There is mild bilateral neural
foraminal narrowing.
IMPRESSION: Lumbar spine spondylosis most notable at L3-L4 with a right
foraminal/far lateral disc protrusion cause severe bilateral neural
foraminal narrowing and moderate central canal stenosis. Also at
L4-L5 with severe bilateral neural foraminal narrowing and moderate
central canal stenosis.

## 2020-12-10 ENCOUNTER — Other Ambulatory Visit: Payer: Self-pay | Admitting: Internal Medicine

## 2020-12-10 DIAGNOSIS — Z1231 Encounter for screening mammogram for malignant neoplasm of breast: Secondary | ICD-10-CM

## 2021-02-26 ENCOUNTER — Ambulatory Visit
Admission: RE | Admit: 2021-02-26 | Discharge: 2021-02-26 | Disposition: A | Discharge status: Home / Self Care | Payer: Medicare Other | Source: Ambulatory Visit | Attending: Internal Medicine | Admitting: Internal Medicine

## 2021-02-26 ENCOUNTER — Other Ambulatory Visit: Payer: Self-pay

## 2021-02-26 DIAGNOSIS — Z1231 Encounter for screening mammogram for malignant neoplasm of breast: Secondary | ICD-10-CM | POA: Insufficient documentation

## 2021-03-17 ENCOUNTER — Emergency Department
Admission: EM | Admit: 2021-03-17 | Discharge: 2021-03-17 | Disposition: A | Discharge status: Home / Self Care | Payer: Medicare Other | Attending: Emergency Medicine | Admitting: Emergency Medicine

## 2021-03-17 ENCOUNTER — Emergency Department: Payer: Medicare Other

## 2021-03-17 ENCOUNTER — Other Ambulatory Visit: Payer: Self-pay

## 2021-03-17 DIAGNOSIS — R519 Headache, unspecified: Secondary | ICD-10-CM | POA: Insufficient documentation

## 2021-03-17 DIAGNOSIS — J449 Chronic obstructive pulmonary disease, unspecified: Secondary | ICD-10-CM | POA: Insufficient documentation

## 2021-03-17 DIAGNOSIS — Z91010 Allergy to peanuts: Secondary | ICD-10-CM | POA: Diagnosis not present

## 2021-03-17 DIAGNOSIS — Z79899 Other long term (current) drug therapy: Secondary | ICD-10-CM | POA: Diagnosis not present

## 2021-03-17 DIAGNOSIS — I5021 Acute systolic (congestive) heart failure: Secondary | ICD-10-CM | POA: Diagnosis not present

## 2021-03-17 DIAGNOSIS — S29019A Strain of muscle and tendon of unspecified wall of thorax, initial encounter: Secondary | ICD-10-CM

## 2021-03-17 DIAGNOSIS — S161XXA Strain of muscle, fascia and tendon at neck level, initial encounter: Secondary | ICD-10-CM | POA: Diagnosis not present

## 2021-03-17 DIAGNOSIS — I2510 Atherosclerotic heart disease of native coronary artery without angina pectoris: Secondary | ICD-10-CM | POA: Insufficient documentation

## 2021-03-17 DIAGNOSIS — Z7982 Long term (current) use of aspirin: Secondary | ICD-10-CM | POA: Insufficient documentation

## 2021-03-17 DIAGNOSIS — S39012A Strain of muscle, fascia and tendon of lower back, initial encounter: Secondary | ICD-10-CM | POA: Diagnosis not present

## 2021-03-17 DIAGNOSIS — L0231 Cutaneous abscess of buttock: Secondary | ICD-10-CM | POA: Insufficient documentation

## 2021-03-17 DIAGNOSIS — S22080A Wedge compression fracture of T11-T12 vertebra, initial encounter for closed fracture: Secondary | ICD-10-CM | POA: Insufficient documentation

## 2021-03-17 DIAGNOSIS — S29012A Strain of muscle and tendon of back wall of thorax, initial encounter: Secondary | ICD-10-CM | POA: Diagnosis not present

## 2021-03-17 DIAGNOSIS — J45909 Unspecified asthma, uncomplicated: Secondary | ICD-10-CM | POA: Insufficient documentation

## 2021-03-17 DIAGNOSIS — S24104A Unspecified injury at T11-T12 level of thoracic spinal cord, initial encounter: Secondary | ICD-10-CM | POA: Diagnosis present

## 2021-03-17 DIAGNOSIS — S22080D Wedge compression fracture of T11-T12 vertebra, subsequent encounter for fracture with routine healing: Secondary | ICD-10-CM

## 2021-03-17 DIAGNOSIS — I110 Hypertensive heart disease with heart failure: Secondary | ICD-10-CM | POA: Diagnosis not present

## 2021-03-17 DIAGNOSIS — Y92410 Unspecified street and highway as the place of occurrence of the external cause: Secondary | ICD-10-CM | POA: Diagnosis not present

## 2021-03-17 DIAGNOSIS — Z87891 Personal history of nicotine dependence: Secondary | ICD-10-CM | POA: Diagnosis not present

## 2021-03-17 MED ORDER — HYDROCODONE-ACETAMINOPHEN 5-325 MG PO TABS
1.0000 | ORAL_TABLET | Freq: Four times a day (QID) | ORAL | 0 refills | Status: AC | PRN
Start: 2021-03-17 — End: ?

## 2021-03-17 MED ORDER — DOXYCYCLINE HYCLATE 100 MG PO TABS: 100.0000 mg | ORAL_TABLET | Freq: Two times a day (BID) | ORAL | 0 refills | Status: DC

## 2021-03-17 MED ORDER — BACLOFEN 10 MG PO TABS: 10.0000 mg | ORAL_TABLET | Freq: Three times a day (TID) | ORAL | 0 refills | Status: AC
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
## 2021-03-17 NOTE — ED Triage Notes (Signed)
No orders at this time per Dr Larinda Buttery
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
## 2021-03-20 ENCOUNTER — Other Ambulatory Visit: Payer: Self-pay | Admitting: Orthopedic Surgery

## 2021-03-20 DIAGNOSIS — S233XXA Sprain of ligaments of thoracic spine, initial encounter: Secondary | ICD-10-CM

## 2021-03-20 DIAGNOSIS — Z8781 Personal history of (healed) traumatic fracture: Secondary | ICD-10-CM

## 2021-03-30 ENCOUNTER — Other Ambulatory Visit: Payer: Self-pay

## 2021-03-30 ENCOUNTER — Ambulatory Visit
Admission: RE | Admit: 2021-03-30 | Discharge: 2021-03-30 | Disposition: A | Discharge status: Home / Self Care | Payer: Medicare Other | Source: Ambulatory Visit | Attending: Orthopedic Surgery | Admitting: Orthopedic Surgery

## 2021-03-30 DIAGNOSIS — Z8781 Personal history of (healed) traumatic fracture: Secondary | ICD-10-CM | POA: Diagnosis present

## 2021-03-30 DIAGNOSIS — S233XXA Sprain of ligaments of thoracic spine, initial encounter: Secondary | ICD-10-CM | POA: Insufficient documentation

## 2021-06-09 ENCOUNTER — Other Ambulatory Visit: Payer: Self-pay | Admitting: Internal Medicine

## 2021-06-09 DIAGNOSIS — M5116 Intervertebral disc disorders with radiculopathy, lumbar region: Secondary | ICD-10-CM

## 2021-06-12 ENCOUNTER — Ambulatory Visit
Admission: RE | Admit: 2021-06-12 | Discharge: 2021-06-12 | Disposition: A | Discharge status: Home / Self Care | Payer: Medicare Other | Source: Ambulatory Visit | Attending: Internal Medicine | Admitting: Internal Medicine

## 2021-06-12 ENCOUNTER — Other Ambulatory Visit: Payer: Self-pay

## 2021-06-12 DIAGNOSIS — M5116 Intervertebral disc disorders with radiculopathy, lumbar region: Secondary | ICD-10-CM | POA: Diagnosis present

## 2021-07-07 ENCOUNTER — Other Ambulatory Visit: Payer: Self-pay | Admitting: Neurological Surgery

## 2021-07-23 NOTE — Pre-Procedure Instructions (Addendum)
Surgical Instructions ? ? ? Your procedure is scheduled on Monday, March 20. ? Report to [facility] Main Entrance "A" at 9:00 A.M., then check in with the Admitting office. ? Call this number if you have problems the morning of surgery: ? [phone] ? ? If you have any questions prior to your surgery date call [phone]: Open Monday-Friday 8am-4pm ? ? ? Remember: ? Do not eat after midnight the night before your surgery ? ?You may drink clear liquids until 08:00AM the morning of your surgery.   ?Clear liquids allowed are: Water, Non-Citrus Juices (without pulp), Carbonated Beverages, Clear Tea, Black Coffee ONLY (NO MILK, CREAM OR POWDERED CREAMER of any kind), and Gatorade ?  ? Take these medicines the morning of surgery with A SIP OF WATER:  ? ?pantoprazole (PROTONIX)  ?cetirizine (ZYRTEC) ?ANORO ELLIPTA 62.5-25 MCG/INH AEPB ?HYDROcodone-acetaminophen (NORCO/VICODIN)  if needed ? ?Please bring your inhalers to the hospital with you.  ? ?Follow your surgeon's instructions on when to stop Aspirin.  If no instructions were given by your surgeon then you will need to call the office to get those instructions.   ? ?As of today, STOP taking Etodolac (Lodine), Aleve, Naproxen, Ibuprofen, Motrin, Advil, Goody's, BC's, all herbal medications, fish oil, and all vitamins. ? ? ?Do NOT Smoke (Tobacco/Vaping)  24 hours prior to your procedure ? ?If you use a CPAP at night, you may bring your mask for your overnight stay. ?  ?Contacts, glasses, hearing aids, dentures or partials may not be worn into surgery, please bring cases for these belongings ?  ?For patients admitted to the hospital, discharge time will be determined by your treatment team. ?  ?Patients discharged the day of surgery will not be allowed to drive home, and someone needs to stay with them for 24 hours. ? ?NO VISITORS WILL BE ALLOWED IN PRE-OP WHERE PATIENTS ARE PREPPED FOR SURGERY.  ONLY 1 SUPPORT PERSON MAY BE PRESENT IN THE WAITING ROOM WHILE YOU ARE  IN SURGERY.  IF YOU ARE TO BE ADMITTED, ONCE YOU ARE IN YOUR ROOM YOU WILL BE ALLOWED TWO (2) VISITORS. 1 (ONE) VISITOR MAY STAY OVERNIGHT BUT MUST ARRIVE TO THE ROOM BY 8pm.  Minor children may have two parents present. Special consideration for safety and communication needs will be reviewed on a case by case basis. ? ?Special instructions:   ? ?Oral Hygiene is also important to reduce your risk of infection.  Remember - BRUSH YOUR TEETH THE MORNING OF SURGERY WITH YOUR REGULAR TOOTHPASTE ? ? ?[facility]- Preparing For Surgery ? ?Before surgery, you can play an important role. Because skin is not sterile, your skin needs to be as free of germs as possible. You can reduce the number of germs on your skin by washing with CHG (chlorahexidine gluconate) Soap before surgery.  CHG is an antiseptic cleaner which kills germs and bonds with the skin to continue killing germs even after washing.   ? ? ?Please do not use if you have an allergy to CHG or antibacterial soaps. If your skin becomes reddened/irritated stop using the CHG.  ?Do not shave (including legs and underarms) for at least 48 hours prior to first CHG shower. It is OK to shave your face. ? ?Please follow these instructions carefully. ?  ? ? Shower the NIGHT BEFORE SURGERY and the MORNING OF SURGERY with CHG Soap.  ? If you chose to wash your hair, wash your hair first as usual with your normal shampoo. After you  shampoo, rinse your hair and body thoroughly to remove the shampoo.  Then [employer] and genitals (private parts) with your normal soap and rinse thoroughly to remove soap. ? ?After that Use CHG Soap as you would any other liquid soap. You can apply CHG directly to the skin and wash gently with a scrungie or a clean washcloth.  ? ?Apply the CHG Soap to your body ONLY FROM THE NECK DOWN.  Do not use on open wounds or open sores. Avoid contact with your eyes, ears, mouth and genitals (private parts). Wash Face and genitals (private parts)  with your  normal soap.  ? ?Wash thoroughly, paying special attention to the area where your surgery will be performed. ? ?Thoroughly rinse your body with warm water from the neck down. ? ?DO NOT shower/wash with your normal soap after using and rinsing off the CHG Soap. ? ?Pat yourself dry with a CLEAN TOWEL. ? ?Wear CLEAN PAJAMAS to bed the night before surgery ? ?Place CLEAN SHEETS on your bed the night before your surgery ? ?DO NOT SLEEP WITH PETS. ? ? ?Day of Surgery: ?Do not wear jewelry or makeup ?Do not wear lotions, powders, perfumes/colognes, or deodorant. ?Do not shave 48 hours prior to surgery.  Men may shave face and neck. ?Do not bring valuables to the hospital. ?Do not wear nail polish, gel polish, artificial nails, or any other type of covering on natural nails (fingers and toes) ?If you have artificial nails or gel coating that need to be removed by a nail salon, please have this removed prior to surgery. Artificial nails or gel coating may interfere with anesthesia's ability to adequately monitor your vital signs. ? ?[facility] is not responsible for any belongings or valuables. ? ?Take a shower with CHG soap. ?Wear Clean/Comfortable clothing the morning of surgery ?Do not apply any deodorants/lotions.   ?Remember to brush your teeth WITH YOUR REGULAR TOOTHPASTE. ? ? ? ?Notify your provider: ?if you are in close contact with someone who has COVID  ?or if you develop a fever of 100.4 or greater, sneezing, cough, sore throat, shortness of breath or body aches. ? ?  ?Please read over the following fact sheets that you were given.  ? ?

## 2021-07-24 ENCOUNTER — Encounter (HOSPITAL_COMMUNITY): Payer: Self-pay

## 2021-07-24 ENCOUNTER — Other Ambulatory Visit: Payer: Self-pay

## 2021-07-24 ENCOUNTER — Encounter (HOSPITAL_COMMUNITY)
Admission: RE | Admit: 2021-07-24 | Discharge: 2021-07-24 | Disposition: A | Discharge status: Home / Self Care | Payer: Medicare Other | Source: Ambulatory Visit | Attending: Neurological Surgery | Admitting: Neurological Surgery

## 2021-07-24 VITALS — BP 119/74 | HR 79 | Temp 97.8°F | Resp 17 | Ht 61.0 in | Wt 241.7 lb

## 2021-07-24 DIAGNOSIS — Z01818 Encounter for other preprocedural examination: Secondary | ICD-10-CM

## 2021-07-24 DIAGNOSIS — K759 Inflammatory liver disease, unspecified: Secondary | ICD-10-CM | POA: Diagnosis not present

## 2021-07-24 LAB — COMPREHENSIVE METABOLIC PANEL
ALT: 15 U/L (ref 0–44)
AST: 17 U/L (ref 15–41)
Albumin: 3.6 g/dL (ref 3.5–5.0)
Alkaline Phosphatase: 90 U/L (ref 38–126)
Anion gap: 10 (ref 5–15)
BUN: 12 mg/dL (ref 8–23)
CO2: 28 mmol/L (ref 22–32)
Calcium: 9.1 mg/dL (ref 8.9–10.3)
Chloride: 99 mmol/L (ref 98–111)
Creatinine, Ser: 0.95 mg/dL (ref 0.44–1.00)
GFR, Estimated: >60 mL/min (ref >60)
Glucose, Bld: 100 mg/dL — ABNORMAL HIGH (ref 70–99)
Potassium: 4 mmol/L (ref 3.5–5.1)
Sodium: 137 mmol/L (ref 135–145)
Total Bilirubin: 1 mg/dL (ref 0.3–1.2)
Total Protein: 7.1 g/dL (ref 6.5–8.1)

## 2021-07-24 LAB — CBC
HCT: 48.4 % — ABNORMAL HIGH (ref 36.0–46.0)
Hemoglobin: 16.1 g/dL — ABNORMAL HIGH (ref 12.0–15.0)
MCH: 32.3 pg (ref 26.0–34.0)
MCHC: 33.3 g/dL (ref 30.0–36.0)
MCV: 97 fL (ref 80.0–100.0)
Platelets: 353 10*3/uL (ref 150–400)
RBC: 4.99 MIL/uL (ref 3.87–5.11)
RDW: 14.1 % (ref 11.5–15.5)
WBC: 11 10*3/uL — ABNORMAL HIGH (ref 4.0–10.5)
nRBC: 0 % (ref 0.0–0.2)

## 2021-07-24 LAB — SURGICAL PCR SCREEN

## 2021-07-24 NOTE — Final Progress Note (Signed)
PCP - Bethann Punches ?Cardiologist - Dr. Lady Gary- pt reports cardiologist has retired and she will follow up with new cardiologist at [facility] in future. Pt reports that she saw Dr. Lady Gary in November, and has had no cardiac symptoms or cardiac issues since. ? ?Chest x-ray - N/A ?EKG - 07/24/2021  ?Stress Test - 02/29/20 ?ECHO - 04/03/11 ?Cardiac Cath - 2006 ? ?Sleep Study - pt denies sleep apnea ? ?Aspirin Instructions: pt stated that per her surgeon she stopped Aspirin, last dose of ASA 07/20/21 ? ?ERAS Protcol -ordered, no drink order ? ?COVID TEST- N/A ? ? ?Anesthesia review: yes, cardiac history- CAD, MI ? ?Patient denies shortness of breath, fever, cough and chest pain at PAT appointment ? ? ?All instructions explained to the patient, with a verbal understanding of the material. Patient agrees to go over the instructions while at home for a better understanding. Patient also instructed to self quarantine after being tested for COVID-19. The opportunity to ask questions was provided. ? ? ?

## 2021-07-25 MED ORDER — VANCOMYCIN HCL 1500 MG/300ML IV SOLN
1500.0000 mg | INTRAVENOUS | Status: AC
  Administered 2021-07-28: 1500 mg via INTRAVENOUS
  Filled 2021-07-25: qty 300

## 2021-07-25 NOTE — Anesthesia Preprocedure Evaluation (Addendum)
Anesthesia Evaluation  ?Patient identified by MRN, date of birth, ID band ?Patient awake ? ? ? ?Reviewed: ?Allergy & Precautions, NPO status , Patient's Chart, lab work & pertinent test results ? ?Airway ?Mallampati: II ? ?TM Distance: <3 FB ?Neck ROM: Full ? ? ? Dental ?no notable dental hx. ? ?  ?Pulmonary ?COPD, Current Smoker,  ?  ?Pulmonary exam normal ?breath sounds clear to auscultation ? ? ? ? ? ? Cardiovascular ?hypertension, + CAD, + Past MI and + Peripheral Vascular Disease  ?Normal cardiovascular exam ?Rhythm:Regular Rate:Normal ? ? ?  ?Neuro/Psych ?negative neurological ROS ? negative psych ROS  ? GI/Hepatic ?Neg liver ROS, GERD  ,  ?Endo/Other  ?Morbid obesity ? Renal/GU ?negative Renal ROS  ?negative genitourinary ?  ?Musculoskeletal ?negative musculoskeletal ROS ?(+)  ? Abdominal ?  ?Peds ?negative pediatric ROS ?(+)  Hematology ?negative hematology ROS ?(+)   ?Anesthesia Other Findings ? ? Reproductive/Obstetrics ?negative OB ROS ? ?  ? ? ? ? ? ? ? ? ? ? ? ? ? ?  ?  ? ? ? ? ? ? ? ?Anesthesia Physical ?Anesthesia Plan ? ?ASA: 3 ? ?Anesthesia Plan: General  ? ?Post-op Pain Management: Dilaudid IV  ? ?Induction: Intravenous ? ?PONV Risk Score and Plan: 2 and Ondansetron, Dexamethasone and Treatment may vary due to age or medical condition ? ?Airway Management Planned: Oral ETT ? ?Additional Equipment:  ? ?Intra-op Plan:  ? ?Post-operative Plan: Extubation in OR ? ?Informed Consent: I have reviewed the patients History and Physical, chart, labs and discussed the procedure including the risks, benefits and alternatives for the proposed anesthesia with the patient or authorized representative who has indicated his/her understanding and acceptance.  ? ? ? ?Dental advisory given ? ?Plan Discussed with: CRNA and Surgeon ? ?Anesthesia Plan Comments: (See APP note by Joslyn Hy, FNP )  ? ? ? ? ? ?Anesthesia Quick Evaluation ? ?

## 2021-07-25 NOTE — Progress Notes (Signed)
Anesthesia Chart Review: ? ? Case: [id number] Date/Time: 07/28/21 1045  ? Procedure: L3-4 L4-5 Minimally invasive laminectomies with Metrx - RM 21/3C  ? Anesthesia type: General  ? Pre-op diagnosis: Lumbar stenosis with neurogenic claudication  ? Location: [facility] ROOM 21 / [facility]  ? Surgeons: Jadene Pierini, MD  ? ?  ? ? ?DISCUSSION: ?Pt is 71 years old with hx CAD (from cardiology notes: cath in 2004 showed 100% LAD with collaterals; insignificant RCA disease, EF 47% - medical mgmt), HTN, dysrhythmia, PAD, asthma, COPD, hepatitis C (treated), liver cirrhosis ? ?VS: BP 119/74   Pulse 79   Temp 36.6 ?C (Oral)   Resp 17   Ht 5\' 1"  (1.549 m)   Wt 109.6 kg   SpO2 98%   BMI 45.67 kg/m?  ? ?PROVIDERS: ?- PCP is , MD (notes in care everywhere)  ?- Cardiologist is Danella Penton, MD. Last office visit 04/01/21 (notes in care everywhere)  ? ? ?LABS: Labs reviewed: Acceptable for surgery. ?(all labs ordered are listed, but only abnormal results are displayed) ? ?Labs Reviewed  ?SURGICAL PCR SCREEN - Abnormal; Notable for the following components:  ?    Result Value  ? MRSA, PCR   (*)   ? Value: INVALID, UNABLE TO DETERMINE THE PRESENCE OF TARGET DUE TO SPECIMEN INTEGRITY. RECOLLECTION REQUESTED.  ? Staphylococcus aureus   (*)   ? Value: INVALID, UNABLE TO DETERMINE THE PRESENCE OF TARGET DUE TO SPECIMEN INTEGRITY. RECOLLECTION REQUESTED.  ? All other components within normal limits  ?COMPREHENSIVE METABOLIC PANEL - Abnormal; Notable for the following components:  ? Glucose, Bld 100 (*)   ? All other components within normal limits  ?CBC - Abnormal; Notable for the following components:  ? WBC 11.0 (*)   ? Hemoglobin 16.1 (*)   ? HCT 48.4 (*)   ? All other components within normal limits  ? ? ?EKG 07/24/21: NSR. RBBB. LAFB. Bifascicular block  ? ? ?CV: ?Nuclear stress test 02/29/20:  ?Blood pressure demonstrated a normal response to exercise. ?Findings consistent with prior myocardial infarction. ?This is an  intermediate risk study. ?The left ventricular ejection fraction is mildly decreased (45-54%). ? ?Echo 07/05/17 (care everywhere):   ?- MODERATE LV SYSTOLIC DYSFUNCTION. Closest EF: 40% (Estimated)  ?- NORMAL RIGHT VENTRICULAR SYSTOLIC FUNCTION  ?- TRIVIAL REGURGITATION NOTED (MR, TR) ?- NO VALVULAR STENOSIS  ?- Contraction: REGIONALLY IMPAIRED  ? ?Past Medical History:  ?Diagnosis Date  ? Acute MI [facility]) 2005  ? NO STENTS  ? Anxiety   ? pt states she does not have this anymore  ? Arthritis   ? Asthma   ? B12 deficiency   ? Bronchitis   ? Chronic back pain   ? Complication of anesthesia   ? Rash and breathing difficulties r/t some local anesthetic-WITH LIDOCAINE, "trouble waking up"  ? COPD (chronic obstructive pulmonary disease) (HCC)   ? Coronary artery disease   ? Costal chondritis   ? DDD (degenerative disc disease), lumbar   ? Depression   ? pt denies, states this was when her husband passed away  ? Diverticulosis   ? Dysrhythmia   ? GERD (gastroesophageal reflux disease)   ? history of ulcers  ? H/O: GI bleed   ? pt denies  ? Hepatitis C   ? TREATED WITH HARVONI  ? History of kidney stones   ? H/O  ? HOH (hard of hearing)   ? Hx of ectopic pregnancy   ? Hypertension   ?  pt denies  ? Liver cirrhosis secondary to NASH [facility])   ? Lumbar herniated disc   ? PAD (peripheral artery disease) (HCC)   ? Peptic ulcer disease   ? Pneumonia [id number]  ? PONV (postoperative nausea and vomiting)   ? pt denies  ? RLS (restless legs syndrome)   ? ? ?Past Surgical History:  ?Procedure Laterality Date  ? ACHILLES TENDON SURGERY Right 03/22/2020  ? Procedure: ACHILLES TENDON LENGTHENING;  Surgeon: Rosetta Posner, DPM;  Location: [facility] ORS;  Service: Podiatry;  Laterality: Right;  ? APPENDECTOMY    ? BUNIONECTOMY Right 03/22/2020  ? Procedure: LAPIDUS BUNIONECTOMY;  Surgeon: Rosetta Posner, DPM;  Location: [facility] ORS;  Service: Podiatry;  Laterality: Right;  ? CARDIAC CATHETERIZATION  2006  ? CATARACT EXTRACTION W/PHACO Right 10/30/2014  ?  Procedure: CATARACT EXTRACTION PHACO AND INTRAOCULAR LENS PLACEMENT (IOC);  Surgeon: Galen Manila, MD;  Location: [facility] ORS;  Service: Ophthalmology;  Laterality: Right;  [place] 00:34 ?AP% 18.5 ?CDE 6.36 ? ?fluid pack lot# [id number]  ? CATARACT EXTRACTION W/PHACO Left 11/13/2014  ? Procedure: CATARACT EXTRACTION PHACO AND INTRAOCULAR LENS PLACEMENT (IOC);  Surgeon: Galen Manila, MD;  Location: [facility] ORS;  Service: Ophthalmology;  Laterality: Left;  [place] 01:04 ?AP% 11.4 ?CDE 7.39 ?fluid pack lot # [id number]  ? CHOLECYSTECTOMY  1980  ? COLONOSCOPY    ? CORONARY ANGIOPLASTY    ? CYSTOSCOPY W/ LITHOLAPAXY / EHL  1992  ? ECTOPIC PREGNANCY SURGERY  1982  ? ESOPHAGOGASTRODUODENOSCOPY (EGD) WITH PROPOFOL N/A 02/17/2018  ? Procedure: ESOPHAGOGASTRODUODENOSCOPY (EGD) WITH PROPOFOL;  Surgeon: Christena Deem, MD;  Location: [facility] ENDOSCOPY;  Service: Endoscopy;  Laterality: N/A;  ? FOOT ARTHRODESIS Right 03/22/2020  ? Procedure: ARTHRODESIS FOOT- STJ RIGHT, NAVICULOCUNEFORM RIGHT;  Surgeon: Rosetta Posner, DPM;  Location: [facility] ORS;  Service: Podiatry;  Laterality: Right;  ? kidney stones    ? MULTIPLE TOOTH EXTRACTIONS    ? as a kid  ? OVARIAN CYST REMOVAL    ? TUBAL LIGATION    ? TUMOR EXCISION  1995  ? vocal cords  ? ? ?MEDICATIONS: ? ALPRAZolam (XANAX) 1 MG tablet  ? ANORO ELLIPTA 62.5-25 MCG/INH AEPB  ? aspirin 81 MG tablet  ? cetirizine (ZYRTEC) 10 MG tablet  ? EPINEPHrine 0.3 mg/0.3 mL IJ SOAJ injection  ? etodolac (LODINE) 400 MG tablet  ? HYDROcodone-acetaminophen (NORCO/VICODIN) 5-325 MG tablet  ? metoprolol (TOPROL-XL) 50 MG 24 hr tablet  ? pantoprazole (PROTONIX) 40 MG tablet  ? Travoprost, BAK Free, (TRAVATAN) 0.004 % SOLN ophthalmic solution  ? vitamin B-12 (CYANOCOBALAMIN) 1000 MCG tablet  ? ?No current facility-administered medications for this encounter.  ? ? ?If no changes, I anticipate pt can proceed with surgery as scheduled.  ? ?Rica Mast, PhD, FNP-BC ?[facility] Short Stay Surgical  Center/Anesthesiology ?Phone: [phone] ?07/25/2021 3:46 PM  ? ? ? ? ? ?

## 2021-07-28 ENCOUNTER — Ambulatory Visit (HOSPITAL_COMMUNITY): Payer: Medicare Other | Admitting: Emergency Medicine

## 2021-07-28 ENCOUNTER — Other Ambulatory Visit: Payer: Self-pay

## 2021-07-28 ENCOUNTER — Ambulatory Visit (HOSPITAL_BASED_OUTPATIENT_CLINIC_OR_DEPARTMENT_OTHER): Payer: Medicare Other | Admitting: Certified Registered"

## 2021-07-28 ENCOUNTER — Ambulatory Visit (HOSPITAL_COMMUNITY): Payer: Medicare Other

## 2021-07-28 ENCOUNTER — Encounter (HOSPITAL_COMMUNITY)
Admission: RE | Disposition: A | Discharge status: Home / Self Care | Payer: Self-pay | Source: Ambulatory Visit | Attending: Neurological Surgery

## 2021-07-28 ENCOUNTER — Observation Stay (HOSPITAL_COMMUNITY)
Admission: RE | Admit: 2021-07-28 | Discharge: 2021-07-29 | Disposition: A | Discharge status: Home / Self Care | Payer: Medicare Other | Source: Ambulatory Visit | Attending: Neurological Surgery | Admitting: Neurological Surgery

## 2021-07-28 ENCOUNTER — Encounter (HOSPITAL_COMMUNITY): Payer: Self-pay | Admitting: Neurological Surgery

## 2021-07-28 DIAGNOSIS — I252 Old myocardial infarction: Secondary | ICD-10-CM | POA: Diagnosis not present

## 2021-07-28 DIAGNOSIS — Z01818 Encounter for other preprocedural examination: Secondary | ICD-10-CM

## 2021-07-28 DIAGNOSIS — I2510 Atherosclerotic heart disease of native coronary artery without angina pectoris: Secondary | ICD-10-CM | POA: Insufficient documentation

## 2021-07-28 DIAGNOSIS — F17210 Nicotine dependence, cigarettes, uncomplicated: Secondary | ICD-10-CM | POA: Insufficient documentation

## 2021-07-28 DIAGNOSIS — J45909 Unspecified asthma, uncomplicated: Secondary | ICD-10-CM | POA: Insufficient documentation

## 2021-07-28 DIAGNOSIS — M48062 Spinal stenosis, lumbar region with neurogenic claudication: Principal | ICD-10-CM | POA: Insufficient documentation

## 2021-07-28 DIAGNOSIS — I10 Essential (primary) hypertension: Secondary | ICD-10-CM | POA: Diagnosis not present

## 2021-07-28 DIAGNOSIS — J449 Chronic obstructive pulmonary disease, unspecified: Secondary | ICD-10-CM | POA: Insufficient documentation

## 2021-07-28 HISTORY — PX: LUMBAR LAMINECTOMY/ DECOMPRESSION WITH MET-RX: SHX5959

## 2021-07-28 LAB — SURGICAL PCR SCREEN
MRSA, PCR: NEGATIVE
Staphylococcus aureus: NEGATIVE

## 2021-07-28 SURGERY — LUMBAR LAMINECTOMY/ DECOMPRESSION WITH MET-RX
Anesthesia: General | Site: Back

## 2021-07-28 MED ORDER — LATANOPROST 0.005 % OP SOLN
1.0000 [drp] | Freq: Every day | OPHTHALMIC | Status: DC
  Filled 2021-07-28: qty 2.5

## 2021-07-28 MED ORDER — METOPROLOL SUCCINATE ER 50 MG PO TB24
50.0000 mg | ORAL_TABLET | Freq: Every day | ORAL | Status: DC
  Administered 2021-07-28: 50 mg via ORAL
  Filled 2021-07-28: qty 1

## 2021-07-28 MED ORDER — ACETAMINOPHEN 10 MG/ML IV SOLN
INTRAVENOUS | Status: AC
  Filled 2021-07-28: qty 100

## 2021-07-28 MED ORDER — THROMBIN 5000 UNITS EX SOLR
CUTANEOUS | Status: AC
  Filled 2021-07-28: qty 5000

## 2021-07-28 MED ORDER — PROMETHAZINE HCL 25 MG/ML IJ SOLN
6.2500 mg | INTRAMUSCULAR | Status: DC | PRN
  Administered 2021-07-28: 6.25 mg via INTRAVENOUS

## 2021-07-28 MED ORDER — PANTOPRAZOLE SODIUM 40 MG PO TBEC
40.0000 mg | DELAYED_RELEASE_TABLET | Freq: Two times a day (BID) | ORAL | Status: DC
  Administered 2021-07-28 – 2021-07-29 (×2): 40 mg via ORAL
  Filled 2021-07-28 (×2): qty 1

## 2021-07-28 MED ORDER — PHENOL 1.4 % MT LIQD: 1.0000 | OROMUCOSAL | Status: DC | PRN

## 2021-07-28 MED ORDER — CHLORHEXIDINE GLUCONATE 0.12 % MT SOLN: 15.0000 mL | Freq: Once | OROMUCOSAL | Status: AC

## 2021-07-28 MED ORDER — SUGAMMADEX SODIUM 200 MG/2ML IV SOLN
INTRAVENOUS | Status: DC | PRN
Start: 2021-07-28 — End: 2021-07-28

## 2021-07-28 MED ORDER — HYDROCODONE-ACETAMINOPHEN 5-325 MG PO TABS: 1.0000 | ORAL_TABLET | ORAL | Status: DC | PRN

## 2021-07-28 MED ORDER — ORAL CARE MOUTH RINSE: 15.0000 mL | Freq: Once | OROMUCOSAL | Status: AC

## 2021-07-28 MED ORDER — THROMBIN 5000 UNITS EX SOLR: OROMUCOSAL | Status: DC | PRN

## 2021-07-28 MED ORDER — HYDROMORPHONE HCL 1 MG/ML IJ SOLN
0.2500 mg | INTRAMUSCULAR | Status: DC | PRN
  Administered 2021-07-28 (×2): 0.5 mg via INTRAVENOUS

## 2021-07-28 MED ORDER — AMISULPRIDE (ANTIEMETIC) 5 MG/2ML IV SOLN
INTRAVENOUS | Status: AC
  Filled 2021-07-28: qty 4

## 2021-07-28 MED ORDER — ACETAMINOPHEN 650 MG RE SUPP: 650.0000 mg | RECTAL | Status: DC | PRN

## 2021-07-28 MED ORDER — PHENYLEPHRINE 40 MCG/ML (10ML) SYRINGE FOR IV PUSH (FOR BLOOD PRESSURE SUPPORT)
PREFILLED_SYRINGE | INTRAVENOUS | Status: DC | PRN
  Administered 2021-07-28 (×3): 80 ug via INTRAVENOUS
  Administered 2021-07-28: 120 ug via INTRAVENOUS

## 2021-07-28 MED ORDER — CYCLOBENZAPRINE HCL 10 MG PO TABS
10.0000 mg | ORAL_TABLET | Freq: Three times a day (TID) | ORAL | Status: DC | PRN
  Administered 2021-07-28: 10 mg via ORAL
  Filled 2021-07-28: qty 1

## 2021-07-28 MED ORDER — SODIUM CHLORIDE 0.9% FLUSH: 3.0000 mL | INTRAVENOUS | Status: DC | PRN

## 2021-07-28 MED ORDER — SODIUM CHLORIDE 0.9% FLUSH
3.0000 mL | Freq: Two times a day (BID) | INTRAVENOUS | Status: DC
  Administered 2021-07-28: 3 mL via INTRAVENOUS

## 2021-07-28 MED ORDER — FENTANYL CITRATE (PF) 250 MCG/5ML IJ SOLN
INTRAMUSCULAR | Status: DC | PRN
  Administered 2021-07-28: 100 ug via INTRAVENOUS
  Administered 2021-07-28: 50 ug via INTRAVENOUS

## 2021-07-28 MED ORDER — ONDANSETRON HCL 4 MG/2ML IJ SOLN
INTRAMUSCULAR | Status: AC
  Filled 2021-07-28: qty 2

## 2021-07-28 MED ORDER — PHENYLEPHRINE HCL-NACL 20-0.9 MG/250ML-% IV SOLN
INTRAVENOUS | Status: DC | PRN
  Administered 2021-07-28: 50 ug/min via INTRAVENOUS

## 2021-07-28 MED ORDER — HYDROMORPHONE HCL 1 MG/ML IJ SOLN: 0.2500 mg | INTRAMUSCULAR | Status: DC | PRN

## 2021-07-28 MED ORDER — 0.9 % SODIUM CHLORIDE (POUR BTL) OPTIME
TOPICAL | Status: DC | PRN
  Administered 2021-07-28: 1000 mL

## 2021-07-28 MED ORDER — ONDANSETRON HCL 4 MG PO TABS: 4.0000 mg | ORAL_TABLET | Freq: Four times a day (QID) | ORAL | Status: DC | PRN

## 2021-07-28 MED ORDER — ONDANSETRON HCL 4 MG/2ML IJ SOLN: 4.0000 mg | Freq: Four times a day (QID) | INTRAMUSCULAR | Status: DC | PRN

## 2021-07-28 MED ORDER — DEXAMETHASONE SODIUM PHOSPHATE 10 MG/ML IJ SOLN
INTRAMUSCULAR | Status: DC | PRN
  Administered 2021-07-28: 10 mg via INTRAVENOUS

## 2021-07-28 MED ORDER — PROMETHAZINE HCL 25 MG/ML IJ SOLN
INTRAMUSCULAR | Status: AC
  Filled 2021-07-28: qty 1

## 2021-07-28 MED ORDER — ACETAMINOPHEN 10 MG/ML IV SOLN
1000.0000 mg | Freq: Once | INTRAVENOUS | Status: DC | PRN
  Administered 2021-07-28: 1000 mg via INTRAVENOUS

## 2021-07-28 MED ORDER — FENTANYL CITRATE (PF) 250 MCG/5ML IJ SOLN
INTRAMUSCULAR | Status: AC
  Filled 2021-07-28: qty 5

## 2021-07-28 MED ORDER — POLYETHYLENE GLYCOL 3350 17 G PO PACK: 17.0000 g | PACK | Freq: Every day | ORAL | Status: DC | PRN

## 2021-07-28 MED ORDER — EPINEPHRINE 0.3 MG/0.3ML IJ SOAJ: 0.3000 mg | Freq: Once | INTRAMUSCULAR | Status: DC | PRN

## 2021-07-28 MED ORDER — BUPIVACAINE-EPINEPHRINE (PF) 0.25% -1:200000 IJ SOLN
INTRAMUSCULAR | Status: AC
  Filled 2021-07-28: qty 30

## 2021-07-28 MED ORDER — SODIUM CHLORIDE 0.9 % IV SOLN
250.0000 mL | INTRAVENOUS | Status: DC
  Administered 2021-07-28: 250 mL via INTRAVENOUS

## 2021-07-28 MED ORDER — UMECLIDINIUM-VILANTEROL 62.5-25 MCG/ACT IN AEPB
1.0000 | INHALATION_SPRAY | RESPIRATORY_TRACT | Status: DC
  Filled 2021-07-28: qty 14

## 2021-07-28 MED ORDER — HYDROMORPHONE HCL 1 MG/ML IJ SOLN
INTRAMUSCULAR | Status: AC
  Filled 2021-07-28: qty 1

## 2021-07-28 MED ORDER — OXYCODONE HCL 5 MG PO TABS
5.0000 mg | ORAL_TABLET | Freq: Once | ORAL | Status: AC | PRN
  Administered 2021-07-28: 5 mg via ORAL

## 2021-07-28 MED ORDER — VITAMIN B-12 1000 MCG PO TABS
1000.0000 ug | ORAL_TABLET | Freq: Every day | ORAL | Status: DC
  Administered 2021-07-28: 1000 ug via ORAL
  Filled 2021-07-28: qty 1

## 2021-07-28 MED ORDER — CYCLOBENZAPRINE HCL 10 MG PO TABS
ORAL_TABLET | ORAL | Status: AC
  Filled 2021-07-28: qty 1

## 2021-07-28 MED ORDER — DEXAMETHASONE SODIUM PHOSPHATE 10 MG/ML IJ SOLN
INTRAMUSCULAR | Status: AC
  Filled 2021-07-28: qty 1

## 2021-07-28 MED ORDER — ONDANSETRON HCL 4 MG/2ML IJ SOLN: 4.0000 mg | Freq: Once | INTRAMUSCULAR | Status: DC | PRN

## 2021-07-28 MED ORDER — HYDROMORPHONE HCL 1 MG/ML IJ SOLN: 1.0000 mg | INTRAMUSCULAR | Status: DC | PRN

## 2021-07-28 MED ORDER — HYDROMORPHONE HCL 1 MG/ML IJ SOLN
INTRAMUSCULAR | Status: AC
Start: 2021-07-28 — End: 2021-07-29
  Filled 2021-07-28: qty 1

## 2021-07-28 MED ORDER — LACTATED RINGERS IV SOLN: INTRAVENOUS | Status: DC

## 2021-07-28 MED ORDER — AMISULPRIDE (ANTIEMETIC) 5 MG/2ML IV SOLN
10.0000 mg | Freq: Once | INTRAVENOUS | Status: AC | PRN
  Administered 2021-07-28: 10 mg via INTRAVENOUS

## 2021-07-28 MED ORDER — HYDROXYZINE HCL 50 MG/ML IM SOLN
50.0000 mg | Freq: Four times a day (QID) | INTRAMUSCULAR | Status: DC | PRN
Start: 2021-07-28 — End: 2021-07-29
  Administered 2021-07-28: 50 mg via INTRAMUSCULAR
  Filled 2021-07-28: qty 1

## 2021-07-28 MED ORDER — ROCURONIUM BROMIDE 10 MG/ML (PF) SYRINGE
PREFILLED_SYRINGE | INTRAVENOUS | Status: AC
  Filled 2021-07-28: qty 10

## 2021-07-28 MED ORDER — MIDAZOLAM HCL 2 MG/2ML IJ SOLN
INTRAMUSCULAR | Status: AC
  Filled 2021-07-28: qty 2

## 2021-07-28 MED ORDER — MIDAZOLAM HCL 5 MG/5ML IJ SOLN
INTRAMUSCULAR | Status: DC | PRN
  Administered 2021-07-28: 2 mg via INTRAVENOUS

## 2021-07-28 MED ORDER — DOCUSATE SODIUM 100 MG PO CAPS
100.0000 mg | ORAL_CAPSULE | Freq: Two times a day (BID) | ORAL | Status: DC
  Administered 2021-07-28 – 2021-07-29 (×2): 100 mg via ORAL
  Filled 2021-07-28 (×2): qty 1

## 2021-07-28 MED ORDER — CHLORHEXIDINE GLUCONATE 0.12 % MT SOLN
OROMUCOSAL | Status: AC
  Administered 2021-07-28: 15 mL via OROMUCOSAL
  Filled 2021-07-28: qty 15

## 2021-07-28 MED ORDER — CHLORHEXIDINE GLUCONATE CLOTH 2 % EX PADS: 6.0000 | MEDICATED_PAD | Freq: Once | CUTANEOUS | Status: DC

## 2021-07-28 MED ORDER — SUGAMMADEX SODIUM 200 MG/2ML IV SOLN
INTRAVENOUS | Status: DC | PRN
  Administered 2021-07-28: 400 mg via INTRAVENOUS

## 2021-07-28 MED ORDER — ROCURONIUM BROMIDE 10 MG/ML (PF) SYRINGE
PREFILLED_SYRINGE | INTRAVENOUS | Status: DC | PRN
  Administered 2021-07-28: 70 mg via INTRAVENOUS
  Administered 2021-07-28: 20 mg via INTRAVENOUS

## 2021-07-28 MED ORDER — MENTHOL 3 MG MT LOZG: 1.0000 | LOZENGE | OROMUCOSAL | Status: DC | PRN

## 2021-07-28 MED ORDER — ACETAMINOPHEN 325 MG PO TABS
650.0000 mg | ORAL_TABLET | ORAL | Status: DC | PRN
  Administered 2021-07-28 – 2021-07-29 (×4): 650 mg via ORAL
  Filled 2021-07-28 (×4): qty 2

## 2021-07-28 MED ORDER — OXYCODONE HCL 5 MG/5ML PO SOLN: 5.0000 mg | Freq: Once | ORAL | Status: AC | PRN

## 2021-07-28 MED ORDER — ONDANSETRON HCL 4 MG/2ML IJ SOLN
INTRAMUSCULAR | Status: DC | PRN
  Administered 2021-07-28: 4 mg via INTRAVENOUS

## 2021-07-28 MED ORDER — ONDANSETRON HCL 4 MG PO TABS: 4.0000 mg | ORAL_TABLET | ORAL | Status: DC | PRN

## 2021-07-28 MED ORDER — ALPRAZOLAM 0.5 MG PO TABS: 0.5000 mg | ORAL_TABLET | Freq: Every evening | ORAL | Status: DC | PRN

## 2021-07-28 MED ORDER — LIDOCAINE-EPINEPHRINE 1 %-1:100000 IJ SOLN
INTRAMUSCULAR | Status: AC
  Filled 2021-07-28: qty 1

## 2021-07-28 MED ORDER — LORATADINE 10 MG PO TABS
10.0000 mg | ORAL_TABLET | Freq: Every day | ORAL | Status: DC
Start: 2021-07-29 — End: 2021-07-29
  Administered 2021-07-29: 10 mg via ORAL
  Filled 2021-07-28: qty 1

## 2021-07-28 MED ORDER — OXYCODONE HCL 5 MG PO TABS
ORAL_TABLET | ORAL | Status: AC
  Filled 2021-07-28: qty 1

## 2021-07-28 MED ORDER — PROPOFOL 10 MG/ML IV BOLUS
INTRAVENOUS | Status: DC | PRN
  Administered 2021-07-28: 150 mg via INTRAVENOUS

## 2021-07-28 MED ORDER — PROPOFOL 10 MG/ML IV BOLUS
INTRAVENOUS | Status: AC
  Filled 2021-07-28: qty 20

## 2021-07-28 SURGICAL SUPPLY — 51 items
ADH SKN CLS APL DERMABOND .7 (GAUZE/BANDAGES/DRESSINGS) ×1
BAG COUNTER SPONGE SURGICOUNT (BAG) ×2 IMPLANT
BAG SPNG CNTER NS LX DISP (BAG) ×1
BAND INSRT 18 STRL LF DISP RB (MISCELLANEOUS) ×2
BAND RUBBER #18 3X1/16 STRL (MISCELLANEOUS) ×4 IMPLANT
BLADE CLIPPER SURG (BLADE) IMPLANT
BLADE SURG 11 STRL SS (BLADE) ×2 IMPLANT
BUR MATCHSTICK NEURO 3.0 LAGG (BURR) ×2 IMPLANT
BUR PRECISION FLUTE 5.0 (BURR) IMPLANT
CANISTER SUCT 3000ML PPV (MISCELLANEOUS) ×2 IMPLANT
DECANTER SPIKE VIAL GLASS SM (MISCELLANEOUS) ×1 IMPLANT
DERMABOND ADVANCED (GAUZE/BANDAGES/DRESSINGS) ×1
DERMABOND ADVANCED .7 DNX12 (GAUZE/BANDAGES/DRESSINGS) ×1 IMPLANT
DRAPE C-ARM 42X72 X-RAY (DRAPES) ×4 IMPLANT
DRAPE LAPAROTOMY 100X72X124 (DRAPES) ×2 IMPLANT
DRAPE MICROSCOPE LEICA (MISCELLANEOUS) ×2 IMPLANT
DRAPE SURG 17X23 STRL (DRAPES) ×1 IMPLANT
DURAPREP 26ML APPLICATOR (WOUND CARE) ×2 IMPLANT
ELECT BLADE 6.5 EXT (BLADE) ×2 IMPLANT
ELECT REM PT RETURN 9FT ADLT (ELECTROSURGICAL) ×2
ELECTRODE REM PT RTRN 9FT ADLT (ELECTROSURGICAL) ×1 IMPLANT
GAUZE 4X4 16PLY ~~LOC~~+RFID DBL (SPONGE) ×1 IMPLANT
GAUZE SPONGE 4X4 12PLY STRL (GAUZE/BANDAGES/DRESSINGS) IMPLANT
GLOVE EXAM NITRILE LRG STRL (GLOVE) IMPLANT
GLOVE EXAM NITRILE XL STR (GLOVE) IMPLANT
GLOVE EXAM NITRILE XS STR PU (GLOVE) IMPLANT
GLOVE SURG LTX SZ7.5 (GLOVE) ×2 IMPLANT
GLOVE SURG UNDER POLY LF SZ6.5 (GLOVE) ×1 IMPLANT
GLOVE SURG UNDER POLY LF SZ7.5 (GLOVE) ×3 IMPLANT
GOWN STRL REUS W/ TWL LRG LVL3 (GOWN DISPOSABLE) ×2 IMPLANT
GOWN STRL REUS W/ TWL XL LVL3 (GOWN DISPOSABLE) IMPLANT
GOWN STRL REUS W/TWL 2XL LVL3 (GOWN DISPOSABLE) IMPLANT
GOWN STRL REUS W/TWL LRG LVL3 (GOWN DISPOSABLE) ×6
GOWN STRL REUS W/TWL XL LVL3 (GOWN DISPOSABLE)
HEMOSTAT POWDER KIT SURGIFOAM (HEMOSTASIS) ×2 IMPLANT
KIT BASIN OR (CUSTOM PROCEDURE TRAY) ×2 IMPLANT
KIT TURNOVER KIT B (KITS) ×2 IMPLANT
NDL SPNL 18GX3.5 QUINCKE PK (NEEDLE) ×1 IMPLANT
NEEDLE HYPO 22GX1.5 SAFETY (NEEDLE) ×2 IMPLANT
NEEDLE SPNL 18GX3.5 QUINCKE PK (NEEDLE) ×2 IMPLANT
NS IRRIG 1000ML POUR BTL (IV SOLUTION) ×2 IMPLANT
PACK LAMINECTOMY NEURO (CUSTOM PROCEDURE TRAY) ×2 IMPLANT
PAD ARMBOARD 7.5X6 YLW CONV (MISCELLANEOUS) ×6 IMPLANT
SPONGE T-LAP 4X18 ~~LOC~~+RFID (SPONGE) ×1 IMPLANT
SUT MNCRL AB 3-0 PS2 18 (SUTURE) ×1 IMPLANT
SUT VIC AB 0 CT1 18XCR BRD8 (SUTURE) IMPLANT
SUT VIC AB 0 CT1 8-18 (SUTURE)
SUT VIC AB 2-0 CP2 18 (SUTURE) ×2 IMPLANT
TOWEL GREEN STERILE (TOWEL DISPOSABLE) ×2 IMPLANT
TOWEL GREEN STERILE FF (TOWEL DISPOSABLE) ×2 IMPLANT
WATER STERILE IRR 1000ML POUR (IV SOLUTION) ×2 IMPLANT

## 2021-07-28 NOTE — Anesthesia Postprocedure Evaluation (Signed)
Anesthesia Post Note ? ?Patient: Kayla Stout ? ?Procedure(s) Performed: Lumbar three-four, Lumbar four-five Minimally invasive laminectomies with Metrx (Back) ? ?  ? ?Patient location during evaluation: PACU ?Anesthesia Type: General ?Level of consciousness: awake and alert ?Pain management: pain level controlled ?Vital Signs Assessment: post-procedure vital signs reviewed and stable ?Respiratory status: spontaneous breathing, nonlabored ventilation, respiratory function stable and patient connected to nasal cannula oxygen ?Cardiovascular status: blood pressure returned to baseline and stable ?Postop Assessment: no apparent nausea or vomiting ?Anesthetic complications: no ? ? ?No notable events documented. ? ?Last Vitals:  ?Vitals:  ? 07/28/21 1400 07/28/21 1428  ?BP: (!) 106/57 104/79  ?Pulse: (!) 55 67  ?Resp: 14 18  ?Temp:  36.6 ?C  ?SpO2: 96% 94%  ?  ?Last Pain:  ?Vitals:  ? 07/28/21 1400  ?TempSrc:   ?PainSc: Asleep  ? ? ?  ?  ?  ?  ?  ?  ? ?Rickard Kennerly S ? ? ? ? ?

## 2021-07-28 NOTE — Op Note (Signed)
PATIENT: Kayla Stout ? ?DAY OF SURGERY: 07/28/21 ?  ?PRE-OPERATIVE DIAGNOSIS:  Lumbar stenosis with neurogenic claudication ?  ?POST-OPERATIVE DIAGNOSIS:  Lumbar stenosis with neurogenic claudication ?  ?PROCEDURE:  Minimally invasive L3-4, L4-5 laminectomies ?  ?SURGEON:  Surgeon(s) and Role: ?   Jadene Pierini, MD - Primary ?  ?ANESTHESIA: ETGA ?  ?BRIEF HISTORY: This is a 71 year old woman who presented with symptoms of neurogenic claudication. The patient was found to have L3-4 and L4-5 central canal stenosis. I therefore recommended a minimally invasive decompression at those levels. This was discussed with the patient as well as risks, benefits, and alternatives and the patient wished to proceed with surgical treatment. ?  ?OPERATIVE DETAIL: The patient was taken to the operating room and placed on the OR table in the prone position. A formal time out was performed with two patient identifiers and confirmed the operative site. Anesthesia was induced by the anesthesia team. The operative site was marked, hair was clipped with surgical clippers, the area was then prepped and draped in a sterile fashion. Fluoroscopy was used to identify the surgical level with a spinal needle.  ? ?A 2cm incision was then marked 1cm off to the right of midline. A right side approach was used to maximize left foraminal decompression, since it radiographically appeared worse and her symptoms were worse on the left. The fascia was incised sharply and serial dilators were docked to the spinolaminar interface on L4. A final tubular distractor was placed and secured to the table and fluoro again confirmed the correct surgical level. The spinous process, lamina, and facet locations were confirmed for orientation. A high speed drill was then used to perform an L4 laminotomy until the insertion of the ligamentum flavum was identified superiorly. This was then extended to complete the L4-5 hemilaminotomy by extending medially to  the midline and laterally to the lateral insertion of the ligamentum flavum. The table was tilted and tubular dilator was adjusted to look across midline. An 'over the top' decompression was then performed by dissecting the ligamentum flavum off of the spinous process and lamina and identifying its insertion superiorly on the lamina and laterally at the facet. A #9 suction was then used to protect the thecal sac while the contralateral lamina was removed. Decompression was continued contralaterally to perform a proximal left sided foraminotomy as well. With drilling complete, the ligamentum flavum was then completely resected from its insertion and dura was palpated in all directions to confirm good decompression. Hemostasis was obtained, the wound was copiously irrigated, and the tube was removed while using the microscope to confirm hemostasis of the muscle edges.  ? ?Dilators were then used through the same incision to dock a tube to the L3 lamino-facet interface and the same technique was used to complete an L3-4 decompression. After good decompression was obtained, the tube was similarly removed under microscopic visualization to confirm hemostasis. The incision was copiously irrigated, all instrument and sponge counts were correct and the incision was then closed in layers. The patient was then returned to anesthesia for emergence. No apparent complications at the completion of the procedure. ?  ?EBL:  11mL ?  ?DRAINS: none ?  ?SPECIMENS: none ?  ?Jadene Pierini, MD ?07/28/21 ?9:54 AM ? ?

## 2021-07-28 NOTE — Anesthesia Procedure Notes (Signed)
Procedure Name: Intubation ?Date/Time: 07/28/2021 10:41 AM ?Performed by: Myna Bright, CRNA ?Pre-anesthesia Checklist: Patient identified, Emergency Drugs available, Suction available and Patient being monitored ?Patient Re-evaluated:Patient Re-evaluated prior to induction ?Oxygen Delivery Method: Circle system utilized ?Preoxygenation: Pre-oxygenation with 100% oxygen ?Induction Type: IV induction ?Ventilation: Mask ventilation without difficulty ?Laryngoscope Size: Mac and 4 ?Grade View: Grade II ?Tube type: Oral ?Tube size: 7.0 mm ?Number of attempts: 1 ?Airway Equipment and Method: Stylet ?Placement Confirmation: ETT inserted through vocal cords under direct vision, positive ETCO2 and breath sounds checked- equal and bilateral ?Secured at: 21 cm ?Tube secured with: Tape ?Dental Injury: Teeth and Oropharynx as per pre-operative assessment  ? ? ? ? ?

## 2021-07-28 NOTE — Transfer of Care (Signed)
Immediate Anesthesia Transfer of Care Note ? ?Patient: Kayla Stout ? ?Procedure(s) Performed: Lumbar three-four, Lumbar four-five Minimally invasive laminectomies with Metrx (Back) ? ?Patient Location: PACU ? ?Anesthesia Type:General ? ?Level of Consciousness: awake, alert , oriented and patient cooperative ? ?Airway & Oxygen Therapy: Patient Spontanous Breathing and Patient connected to face mask oxygen ? ?Post-op Assessment: Report given to RN, Post -op Vital signs reviewed and stable and Patient moving all extremities ? ?Post vital signs: Reviewed and stable ? ?Last Vitals:  ?Vitals Value Taken Time  ?BP 138/66 07/28/21 1300  ?Temp 36.8 ?C 07/28/21 1300  ?Pulse 69 07/28/21 1301  ?Resp 16 07/28/21 1301  ?SpO2 100 % 07/28/21 1301  ?Vitals shown include unvalidated device data. ? ?Last Pain:  ?Vitals:  ? 07/28/21 0943  ?TempSrc:   ?PainSc: 8   ?   ? ?  ? ?Complications: No notable events documented. ?

## 2021-07-28 NOTE — H&P (Signed)
Surgical H&P Update ? ?HPI: 71 y.o. with a history of lumbar stenosis with neurogenic claudication. Symptoms were unfortunately refractory to non-surgical treatment. No changes in health since they were last seen. Still having the above and wishes to proceed with surgery. ? ?PMHx:  ?Past Medical History:  ?Diagnosis Date  ? Acute MI [facility]) 71  ? NO STENTS  ? Anxiety   ? pt states she does not have this anymore  ? Arthritis   ? Asthma   ? B12 deficiency   ? Bronchitis   ? Chronic back pain   ? Complication of anesthesia   ? Rash and breathing difficulties r/t some local anesthetic-WITH LIDOCAINE, "trouble waking up"  ? COPD (chronic obstructive pulmonary disease) (HCC)   ? Coronary artery disease   ? Costal chondritis   ? DDD (degenerative disc disease), lumbar   ? Depression   ? pt denies, states this was when her husband passed away  ? Diverticulosis   ? Dysrhythmia   ? GERD (gastroesophageal reflux disease)   ? history of ulcers  ? H/O: GI bleed   ? pt denies  ? Hepatitis C   ? TREATED WITH HARVONI  ? History of kidney stones   ? H/O  ? HOH (hard of hearing)   ? Hx of ectopic pregnancy   ? Hypertension   ? pt denies  ? Liver cirrhosis secondary to NASH [facility])   ? Lumbar herniated disc   ? PAD (peripheral artery disease) (HCC)   ? Peptic ulcer disease   ? Pneumonia 71  ? PONV (postoperative nausea and vomiting)   ? pt denies  ? RLS (restless legs syndrome)   ? ?FamHx: History reviewed. No pertinent family history. ?SocHx:  reports that she has been smoking cigarettes. She has a 5.00 pack-year smoking history. She has never used smokeless tobacco. She reports that she does not drink alcohol and does not use drugs. ? ?Physical Exam: ?Strength 5/5 x4 and SILTx4 except baseline R lower extremity CPN numbness and some dec'd ROM in the R ankle 2/2 prior surgery ? ?Assesment/Plan: ?71 y.o. woman with lumbar stenosis and neurogenic claudication, here for L3-4/L4-5 MIS decompression. Risks, benefits, and alternatives  discussed and the patient would like to continue with surgery. ? ?-OR today ?-3C post-op ? ?Jadene Pierini, MD ?07/28/21 ?9:53 AM ? ?

## 2021-07-29 ENCOUNTER — Encounter (HOSPITAL_COMMUNITY): Payer: Self-pay | Admitting: Neurological Surgery

## 2021-07-29 DIAGNOSIS — M48062 Spinal stenosis, lumbar region with neurogenic claudication: Secondary | ICD-10-CM | POA: Diagnosis not present

## 2021-07-29 MED ORDER — CYCLOBENZAPRINE HCL 10 MG PO TABS: 10.0000 mg | ORAL_TABLET | Freq: Three times a day (TID) | ORAL | 0 refills | Status: AC | PRN

## 2021-07-29 NOTE — Plan of Care (Signed)
WNL

## 2021-07-29 NOTE — Progress Notes (Signed)
Patient alert and oriented, mae's well, voiding adequate amount of urine, swallowing without difficulty, no c/o pain at time of discharge. Patient discharged home with family. Script and discharged instructions given to patient. Patient and family stated understanding of instructions given. Patient has an appointment with Dr. Ostergard   

## 2021-07-29 NOTE — Progress Notes (Signed)
Neurosurgery Service ?Progress Note ? ?Subjective: No acute events overnight, preop radicular pain resolved, back pain controlled w/ tylenol  ? ?Objective: ?Vitals:  ? 07/29/21 0112 07/29/21 0440 07/29/21 0753 07/29/21 1145  ?BP: (!) 113/51 116/73 110/61 (!) 119/58  ?Pulse: 71 66 68 74  ?Resp: 18 20 18 18   ?Temp: 98.5 ?F (36.9 ?C) 98 ?F (36.7 ?C) 98.6 ?F (37 ?C) 98.3 ?F (36.8 ?C)  ?TempSrc: Oral Oral Oral Oral  ?SpO2: 95% 96% 95% 95%  ?Weight:      ?Height:      ? ? ?Physical Exam: ?Strength 5/5 x4 and SILTx4 except baseline RLE dec'd ROM and numbness ? ?Assessment & Plan: ?71 y.o. woman s/p 2 level MIS laminectomies, recovering well. ? ?-discharge home  ? ?[address] Kayla Stout  ?07/29/21 ?12:04 PM ? ?

## 2021-07-29 NOTE — Evaluation (Signed)
Occupational Therapy Evaluation/Discharge ?Patient Details ?Name: Kayla Stout ?MRN: [id number] ?DOB: 07-23-1950 ?Today's Date: 07/29/2021 ? ? ?History of Present Illness Pt is a 71 y/o female who presented for L3-4/L4-5 laminectomies in setting of neurogenic claudication and stenosis at operative levels. PMH: MI, COPD, DDD, GERD, GI bleed, HTN, PAD  ? ?Clinical Impression ?  ?PTA, pt lives alone and reports Modified Independence in all ADLs, IADLs, and mobility with recent Rollator use due to progressive back pain. Pt presents now feeling better s/p sx above and eager to return home. Educated on back precautions for ADLs with pt able to return demo basic ADLs and in-room mobility using RW with Modified Independence. Educated on strategies for IADLs with emphasis on body mechanics. Pt reports family nearby who are assisting with groceries today and will be assisting with caring for her dog initially. Anticipate no OT needs at DC or acute level. Will sign off.   ?   ? ?Recommendations for follow up therapy are one component of a multi-disciplinary discharge planning process, led by the attending physician.  Recommendations may be updated based on patient status, additional functional criteria and insurance authorization.  ? ?Follow Up Recommendations ? No OT follow up  ?  ?Assistance Recommended at Discharge PRN  ?Patient can return home with the following Assist for transportation ? ?  ?Functional Status Assessment ? Patient has had a recent decline in their functional status and demonstrates the ability to make significant improvements in function in a reasonable and predictable amount of time.  ?Equipment Recommendations ? None recommended by OT  ?  ?Recommendations for Other Services   ? ? ?  ?Precautions / Restrictions Precautions ?Precautions: Fall;Back ?Precaution Booklet Issued: Yes (comment) ?Precaution Comments: no brace needed per orders ?Restrictions ?Weight Bearing Restrictions: No  ? ?  ? ?Mobility Bed  Mobility ?Overal bed mobility: Modified Independent ?  ?  ?  ?  ?  ?  ?General bed mobility comments: cued for log rolling with good carryover ?  ? ?Transfers ?Overall transfer level: Modified independent ?Equipment used: None, Rolling walker (2 wheels) ?  ?  ?  ?  ?  ?  ?  ?General transfer comment: able to stand without AD for LB dressing tasks ?  ? ?  ?Balance Overall balance assessment: Needs assistance ?Sitting-balance support: Feet supported, No upper extremity supported ?Sitting balance-Leahy Scale: Good ?  ?  ?Standing balance support: Bilateral upper extremity supported, No upper extremity supported, During functional activity ?Standing balance-Leahy Scale: Fair ?Standing balance comment: able to stand without support for LB ADLs, benefits from BUE support for mobility ?  ?  ?  ?  ?  ?  ?  ?  ?  ?  ?  ?   ? ?ADL either performed or assessed with clinical judgement  ? ?ADL Overall ADL's : Modified independent ?  ?  ?  ?  ?  ?  ?  ?  ?  ?  ?  ?  ?  ?  ?  ?  ?  ?  ?  ?General ADL Comments: Educated on back precautions for ADLs/IADLs with pt able to return demo UB/LB dressing tasks (propping LE up on bed to reach feet w/o breaking precautions), mobility in room with RW and left brushing teeth at sink in bathroom with modified Independence. Emphasis on body mechanics with IADLs. Pt reports family assisted with laundry, groceries and will be caring for her dog initially  ? ? ? ?Vision Baseline Vision/History:  1 Wears glasses (reading) ?Ability to See in Adequate Light: 0 Adequate ?Patient Visual Report: No change from baseline ?Vision Assessment?: No apparent visual deficits  ?   ?Perception   ?  ?Praxis   ?  ? ?Pertinent Vitals/Pain Pain Assessment ?Pain Assessment: Faces ?Faces Pain Scale: Hurts a little bit ?Pain Location: lowback ?Pain Descriptors / Indicators: Sore ?Pain Intervention(s): Monitored during session  ? ? ? ?Hand Dominance Right ?  ?Extremity/Trunk Assessment Upper Extremity Assessment ?Upper  Extremity Assessment: Overall WFL for tasks assessed ?  ?Lower Extremity Assessment ?Lower Extremity Assessment: Defer to PT evaluation ?  ?Cervical / Trunk Assessment ?Cervical / Trunk Assessment: Back Surgery ?  ?Communication Communication ?Communication: No difficulties ?  ?Cognition Arousal/Alertness: Awake/alert ?Behavior During Therapy: [facility] for tasks assessed/performed ?Overall Cognitive Status: Within Functional Limits for tasks assessed ?  ?  ?  ?  ?  ?  ?  ?  ?  ?  ?  ?  ?  ?  ?  ?  ?  ?  ?  ?General Comments    ? ?  ?Exercises   ?  ?Shoulder Instructions    ? ? ?Home Living Family/patient expects to be discharged to:: Private residence ?Living Arrangements: Alone ?Available Help at Discharge: Family;Available PRN/intermittently ?Type of Home: House ?Home Access: Stairs to enter ?Entrance Stairs-Number of Steps: 4 ?  ?Home Layout: One level ?  ?  ?Bathroom Shower/Tub: Walk-in shower ?  ?Bathroom Toilet: Standard ?Bathroom Accessibility: Yes ?How Accessible: Accessible via walker ?Home Equipment: Rollator (4 wheels);Shower seat;Hand held shower head;Adaptive equipment;Other (comment) (knee scooter) ?Adaptive Equipment: Long-handled sponge ?  ?  ? ?  ?Prior Functioning/Environment Prior Level of Function : Independent/Modified Independent;Driving ?  ?  ?  ?  ?  ?  ?Mobility Comments: recently began using Rollator for mobility d/t back pain ?ADLs Comments: Independent with ADLs, IADLs, driving, grocery shopping, and caring for 100lb boxer dog ?  ? ?  ?  ?OT Problem List:   ?  ?   ?OT Treatment/Interventions:    ?  ?OT Goals(Current goals can be found in the care plan section) Acute Rehab OT Goals ?Patient Stated Goal: recover well/quickly ?OT Goal Formulation: All assessment and education complete, DC therapy  ?OT Frequency:   ?  ? ?Co-evaluation   ?  ?  ?  ?  ? ?  ?AM-PAC OT "6 Clicks" Daily Activity     ?Outcome Measure Help from another person eating meals?: None ?Help from another person taking care of  personal grooming?: None ?Help from another person toileting, which includes using toliet, bedpan, or urinal?: None ?Help from another person bathing (including washing, rinsing, drying)?: None ?Help from another person to put on and taking off regular upper body clothing?: None ?Help from another person to put on and taking off regular lower body clothing?: None ?6 Click Score: 24 ?  ?End of Session Equipment Utilized During Treatment: Rolling walker (2 wheels) ?Nurse Communication: Mobility status ? ?Activity Tolerance: Patient tolerated treatment well ?Patient left: Other (comment) (in bathroom) ? ?OT Visit Diagnosis: Pain ?Pain - part of body:  (back)  ?              ?Time: [id number] ?OT Time Calculation (min): 22 min ?Charges:  OT General Charges ?$OT Visit: 1 Visit ?OT Evaluation ?$OT Eval Low Complexity: 1 Low ? ?Malachy Chamber, OTR/L ?Acute Rehab Services ?Office: [phone]  ? ?Layla Maw ?07/29/2021, 8:36 AM ?

## 2021-07-29 NOTE — Discharge Summary (Signed)
Discharge Summary ? ?Date of Admission: 07/28/2021 ? ?Date of Discharge: 07/29/21 ? ?Attending Physician: Autumn Patty, MD ? ?Hospital Course: Patient was admitted following an uncomplicated 2 level MIS decompression. They were recovered in PACU and transferred to [facility]. Their preop symptoms were completely resolved, their hospital course was uncomplicated and the patient was discharged home on 07/29/21. They will follow up in clinic with me in clinic in 2 weeks. ? ?Neurologic exam at discharge:  ?Strength 5/5 x4 and SILTx4 except baseline RLE dec'd ROM and numbness ? ?Discharge diagnosis: Lumbar stenosis ? ?Jadene Pierini, MD ?07/29/21 ?12:06 PM ? ? ?

## 2021-07-29 NOTE — Evaluation (Signed)
Physical Therapy Evaluation ?Patient Details ?Name: Kayla MenghiniKathy S Stout ?MRN: [id number] ?DOB: [birth year] ?Today's Date: 07/29/2021 ? ?History of Present Illness ? Pt is a 71 y/o female who presented for L3-4/L4-5 laminectomies on 07/28/2021. PMH significant for MI, COPD, DDD, GI bleed, HTN, PAD ?  ?Clinical Impression ? Pt admitted with above diagnosis. At the time of PT eval, pt was able to demonstrate transfers and ambulation with gross min guard assist to supervision for safety and RW for support. Overall poor maintenance of precautions throughout functional mobility as well as overall decreased safety awareness. Pt was educated on precautions, brace application/wearing schedule, appropriate activity progression, and car transfer. Pt currently with functional limitations due to the deficits listed below (see PT Problem List). Pt will benefit from skilled PT to increase their independence and safety with mobility to allow discharge to the venue listed below.     ?   ? ?Recommendations for follow up therapy are one component of a multi-disciplinary discharge planning process, led by the attending physician.  Recommendations may be updated based on patient status, additional functional criteria and insurance authorization. ? ?Follow Up Recommendations No PT follow up ? ?  ?Assistance Recommended at Discharge PRN  ?Patient can return home with the following ? A little help with walking and/or transfers;Assistance with cooking/housework;Assist for transportation;Help with stairs or ramp for entrance ? ?  ?Equipment Recommendations Rolling walker (2 wheels)  ?Recommendations for Other Services ?    ?  ?Functional Status Assessment Patient has had a recent decline in their functional status and demonstrates the ability to make significant improvements in function in a reasonable and predictable amount of time.  ? ?  ?Precautions / Restrictions Precautions ?Precautions: Fall;Back ?Precaution Booklet Issued: Yes  (comment) ?Precaution Comments: no brace needed per orders ?Restrictions ?Weight Bearing Restrictions: No  ? ?  ? ?Mobility ? Bed Mobility ?Overal bed mobility: Needs Assistance ?Bed Mobility: Rolling, Sidelying to Sit, Sit to Sidelying ?Rolling: Supervision ?Sidelying to sit: Supervision ?  ?  ?Sit to sidelying: Supervision ?General bed mobility comments: No assist required however pt with poor log roll technique. Pt attempting to crawl into bed forwards with knee onto bed first. Max cues for safe entrance into bed. Difficulty managing with HOB flat and rails lowered to simulate home environment. ?  ? ?Transfers ?Overall transfer level: Needs assistance ?Equipment used: Rolling walker (2 wheels) ?Transfers: Sit to/from Stand ?Sit to Stand: Supervision ?  ?  ?  ?  ?  ?General transfer comment: VC's for improved posture with power up to full stand. No unsteadiness or LOB noted. ?  ? ?Ambulation/Gait ?Ambulation/Gait assistance: Supervision ?Gait Distance (Feet): 200 Feet ?Assistive device: Rolling walker (2 wheels) ?Gait Pattern/deviations: Step-through pattern, Decreased stride length, Trunk flexed ?Gait velocity: Decreased ?Gait velocity interpretation: <1.31 ft/sec, indicative of household ambulator ?  ?General Gait Details: VC's for improved posture, closer walker proximity and forward gaze. Pt with overall flexed posture and difficulty maintaining precautions throughout session. ? ?Stairs ?Stairs:  (Pt declined) ?  ?  ?  ?  ? ?Wheelchair Mobility ?  ? ?Modified Rankin (Stroke Patients Only) ?  ? ?  ? ?Balance Overall balance assessment: Needs assistance ?Sitting-balance support: Feet supported, No upper extremity supported ?Sitting balance-Leahy Scale: Good ?  ?  ?Standing balance support: Bilateral upper extremity supported, No upper extremity supported, During functional activity ?Standing balance-Leahy Scale: Fair ?Standing balance comment: able to stand without support for LB ADLs, benefits from BUE support  for mobility ?  ?  ?  ?  ?  ?  ?  ?  ?  ?  ?  ?   ? ? ? ?  Pertinent Vitals/Pain Pain Assessment ?Pain Assessment: Faces ?Faces Pain Scale: Hurts a little bit ?Pain Location: low back ?Pain Descriptors / Indicators: Sore ?Pain Intervention(s): Limited activity within patient's tolerance, Monitored during session, Repositioned  ? ? ?Home Living Family/patient expects to be discharged to:: Private residence ?Living Arrangements: Alone ?Available Help at Discharge: Family;Available PRN/intermittently ?Type of Home: House ?Home Access: Stairs to enter ?  ?Entrance Stairs-Number of Steps: 4 ?  ?Home Layout: One level ?Home Equipment: Rollator (4 wheels);Shower seat;Hand held shower head;Adaptive equipment;Other (comment) (knee scooter) ?   ?  ?Prior Function Prior Level of Function : Independent/Modified Independent;Driving ?  ?  ?  ?  ?  ?  ?Mobility Comments: recently began using Rollator for mobility d/t back pain ?ADLs Comments: Independent with ADLs, IADLs, driving, grocery shopping, and caring for 100lb boxer dog ?  ? ? ?Hand Dominance  ? Dominant Hand: Right ? ?  ?Extremity/Trunk Assessment  ? Upper Extremity Assessment ?Upper Extremity Assessment: Overall WFL for tasks assessed ?  ? ?Lower Extremity Assessment ?Lower Extremity Assessment: RLE deficits/detail ?RLE Deficits / Details: Decreased strength and hip AROM compared to the LLE ?  ? ?Cervical / Trunk Assessment ?Cervical / Trunk Assessment: Back Surgery  ?Communication  ? Communication: No difficulties  ?Cognition Arousal/Alertness: Awake/alert ?Behavior During Therapy: [facility] for tasks assessed/performed ?Overall Cognitive Status: Impaired/Different from baseline ?Area of Impairment: Safety/judgement, Memory ?  ?  ?  ?  ?  ?  ?  ?  ?  ?  ?Memory: Decreased recall of precautions ?  ?Safety/Judgement: Decreased awareness of safety ?  ?  ?  ?  ?  ? ?  ?General Comments   ? ?  ?Exercises    ? ?Assessment/Plan  ?  ?PT Assessment Patient needs continued PT services   ?PT Problem List Decreased strength;Decreased activity tolerance;Decreased balance;Decreased mobility;Decreased knowledge of use of DME;Decreased knowledge of precautions;Decreased safety awareness;Pain ? ?   ?  ?PT Treatment Interventions DME instruction;Gait training;Functional mobility training;Therapeutic activities;Therapeutic exercise;Balance training;Patient/family education   ? ?PT Goals (Current goals can be found in the Care Plan section)  ?Acute Rehab PT Goals ?Patient Stated Goal: Home today ?PT Goal Formulation: With patient ?Time For Goal Achievement: 08/05/21 ?Potential to Achieve Goals: Good ? ?  ?Frequency Min 5X/week ?  ? ? ?Co-evaluation   ?  ?  ?  ?  ? ? ?  ?AM-PAC PT "6 Clicks" Mobility  ?Outcome Measure Help needed turning from your back to your side while in a flat bed without using bedrails?: A Little ?Help needed moving from lying on your back to sitting on the side of a flat bed without using bedrails?: A Little ?Help needed moving to and from a bed to a chair (including a wheelchair)?: A Little ?Help needed standing up from a chair using your arms (e.g., wheelchair or bedside chair)?: A Little ?Help needed to walk in hospital room?: A Little ?Help needed climbing 3-5 steps with a railing? : A Little ?6 Click Score: 18 ? ?  ?End of Session Equipment Utilized During Treatment: Gait belt ?Activity Tolerance: Patient tolerated treatment well ?Patient left: in bed;with call bell/phone within reach ?Nurse Communication: Mobility status ?PT Visit Diagnosis: Unsteadiness on feet (R26.81);Pain ?Pain - part of body:  (back) ?  ? ?Time: 1610-9604 ?PT Time Calculation (min) (ACUTE ONLY): 19 min ? ? ?Charges:   PT Evaluation ?$PT Eval Low Complexity: 1 Low ?  ?  ?   ? ? ?Conni Slipper, PT, DPT ?Acute  Rehabilitation Services ?Pager: [phone] ?Office: [phone]  ? ?Kayla Stout ?07/29/2021, 3:30 PM ? ?

## 2021-11-06 ENCOUNTER — Other Ambulatory Visit: Payer: Self-pay | Admitting: Orthopedic Surgery

## 2021-11-06 DIAGNOSIS — M25562 Pain in left knee: Secondary | ICD-10-CM

## 2021-11-13 ENCOUNTER — Ambulatory Visit
Admission: RE | Admit: 2021-11-13 | Discharge: 2021-11-13 | Disposition: A | Discharge status: Home / Self Care | Payer: Medicare Other | Source: Ambulatory Visit | Attending: Orthopedic Surgery | Admitting: Orthopedic Surgery

## 2021-11-13 DIAGNOSIS — M25562 Pain in left knee: Secondary | ICD-10-CM | POA: Insufficient documentation

## 2022-01-19 ENCOUNTER — Other Ambulatory Visit: Payer: Self-pay | Admitting: Internal Medicine

## 2022-01-19 DIAGNOSIS — Z1231 Encounter for screening mammogram for malignant neoplasm of breast: Secondary | ICD-10-CM

## 2022-02-27 ENCOUNTER — Ambulatory Visit
Admission: RE | Admit: 2022-02-27 | Discharge: 2022-02-27 | Disposition: A | Discharge status: Home / Self Care | Payer: Medicare Other | Source: Ambulatory Visit | Attending: Internal Medicine | Admitting: Internal Medicine

## 2022-02-27 DIAGNOSIS — Z1231 Encounter for screening mammogram for malignant neoplasm of breast: Secondary | ICD-10-CM | POA: Diagnosis not present

## 2022-12-08 ENCOUNTER — Other Ambulatory Visit: Payer: Self-pay | Admitting: Internal Medicine

## 2022-12-08 ENCOUNTER — Ambulatory Visit
Admission: RE | Admit: 2022-12-08 | Discharge: 2022-12-08 | Disposition: A | Discharge status: Home / Self Care | Payer: Medicare Other | Source: Ambulatory Visit | Attending: Internal Medicine | Admitting: Internal Medicine

## 2022-12-08 DIAGNOSIS — N23 Unspecified renal colic: Secondary | ICD-10-CM | POA: Diagnosis present

## 2022-12-08 DIAGNOSIS — R1031 Right lower quadrant pain: Secondary | ICD-10-CM

## 2023-02-12 ENCOUNTER — Other Ambulatory Visit: Payer: Self-pay | Admitting: Internal Medicine

## 2023-02-12 DIAGNOSIS — Z1231 Encounter for screening mammogram for malignant neoplasm of breast: Secondary | ICD-10-CM

## 2023-03-18 ENCOUNTER — Ambulatory Visit
Admission: RE | Admit: 2023-03-18 | Discharge: 2023-03-18 | Disposition: A | Discharge status: Home / Self Care | Payer: Medicare Other | Source: Ambulatory Visit | Attending: Internal Medicine | Admitting: Internal Medicine

## 2023-03-18 DIAGNOSIS — Z1231 Encounter for screening mammogram for malignant neoplasm of breast: Secondary | ICD-10-CM | POA: Insufficient documentation

## 2024-03-08 ENCOUNTER — Other Ambulatory Visit: Payer: Self-pay | Admitting: Internal Medicine

## 2024-03-08 DIAGNOSIS — Z1231 Encounter for screening mammogram for malignant neoplasm of breast: Secondary | ICD-10-CM

## 2024-04-13 ENCOUNTER — Ambulatory Visit
Admission: RE | Admit: 2024-04-13 | Discharge: 2024-04-13 | Disposition: A | Source: Ambulatory Visit | Attending: Internal Medicine | Admitting: Internal Medicine

## 2024-04-13 DIAGNOSIS — Z1231 Encounter for screening mammogram for malignant neoplasm of breast: Secondary | ICD-10-CM | POA: Insufficient documentation

## 2024-05-19 ENCOUNTER — Other Ambulatory Visit: Payer: Self-pay

## 2024-05-19 ENCOUNTER — Emergency Department

## 2024-05-19 ENCOUNTER — Emergency Department: Admission: EM | Admit: 2024-05-19 | Discharge: 2024-05-19 | Disposition: A | Discharge status: Home / Self Care

## 2024-05-19 DIAGNOSIS — F17200 Nicotine dependence, unspecified, uncomplicated: Secondary | ICD-10-CM | POA: Diagnosis not present

## 2024-05-19 DIAGNOSIS — R079 Chest pain, unspecified: Secondary | ICD-10-CM | POA: Insufficient documentation

## 2024-05-19 DIAGNOSIS — I2510 Atherosclerotic heart disease of native coronary artery without angina pectoris: Secondary | ICD-10-CM | POA: Insufficient documentation

## 2024-05-19 DIAGNOSIS — R110 Nausea: Secondary | ICD-10-CM | POA: Diagnosis not present

## 2024-05-19 DIAGNOSIS — I10 Essential (primary) hypertension: Secondary | ICD-10-CM | POA: Insufficient documentation

## 2024-05-19 LAB — BASIC METABOLIC PANEL WITH GFR
Anion gap: 8 (ref 5–15)
BUN: 14 mg/dL (ref 8–23)
CO2: 26 mmol/L (ref 22–32)
Calcium: 8 mg/dL — ABNORMAL LOW (ref 8.9–10.3)
Chloride: 101 mmol/L (ref 98–111)
Creatinine, Ser: 0.75 mg/dL (ref 0.44–1.00)
GFR, Estimated: >60 mL/min
Glucose, Bld: 83 mg/dL (ref 70–99)
Potassium: 4.5 mmol/L (ref 3.5–5.1)
Sodium: 134 mmol/L — ABNORMAL LOW (ref 135–145)

## 2024-05-19 LAB — CBC
HCT: 42.4 % (ref 36.0–46.0)
Hemoglobin: 13.8 g/dL (ref 12.0–15.0)
MCH: 31.6 pg (ref 26.0–34.0)
MCHC: 32.5 g/dL (ref 30.0–36.0)
MCV: 97 fL (ref 80.0–100.0)
Platelets: 348 K/uL (ref 150–400)
RBC: 4.37 MIL/uL (ref 3.87–5.11)
RDW: 14.4 % (ref 11.5–15.5)
WBC: 10.4 K/uL (ref 4.0–10.5)
nRBC: 0 % (ref 0.0–0.2)

## 2024-05-19 LAB — TROPONIN T, HIGH SENSITIVITY
Troponin T High Sensitivity: <15 ng/L (ref 0–19)
Troponin T High Sensitivity: <15 ng/L (ref 0–19)

## 2024-05-19 MED ORDER — SUCRALFATE 1 G PO TABS: 1.0000 g | ORAL_TABLET | Freq: Once | ORAL | Status: DC

## 2024-05-19 MED ORDER — FAMOTIDINE IN NACL 20-0.9 MG/50ML-% IV SOLN
20.0000 mg | Freq: Once | INTRAVENOUS | Status: AC
  Administered 2024-05-19: 20 mg via INTRAVENOUS
  Filled 2024-05-19: qty 50

## 2024-05-19 NOTE — ED Provider Notes (Signed)
 "  [facility] Provider Note    Event Date/Time   First MD Initiated Contact with Patient 05/19/24 1516     (approximate)   History   Chest Pain   HPI  Kayla Stout is a 74 y.o. female with CAD followed by [facility] cardiology, hepatitis C, hypertension, NASH, obesity, ongoing smoking who presents with 1 hour of chest pain and nausea.  Patient states that she awoke in her normal state of health.  Had a banana and several cups of coffee.  After 1 hour of consuming these food stops developed 10-10 chest pain and nausea.  She reports never experiencing similar symptoms before.  There was associated lightheadedness and reported numbness to the lips initially however this is resolved.  She arrives via EMS and got 324 of aspirin  and 1 spray of nitroglycerin and route.  Symptoms have resolved.  She reports compliance of her medications     Physical Exam   Triage Vital Signs: ED Triage Vitals  Encounter Vitals Group     BP 05/19/24 1236 117/82     Girls Systolic BP Percentile --      Girls Diastolic BP Percentile --      Boys Systolic BP Percentile --      Boys Diastolic BP Percentile --      Pulse Rate 05/19/24 1236 67     Resp 05/19/24 1236 16     Temp 05/19/24 1236 98 F (36.7 C)     Temp Source 05/19/24 1236 Oral     SpO2 05/19/24 1227 97 %     Weight 05/19/24 1231 225 lb (102.1 kg)     Height 05/19/24 1231 5' (1.524 m)     Head Circumference --      Peak Flow --      Pain Score 05/19/24 1231 5     Pain Loc --      Pain Education --      Exclude from Growth Chart --     Most recent vital signs: Vitals:   05/19/24 1227 05/19/24 1236  BP:  117/82  Pulse:  67  Resp:  16  Temp:  98 F (36.7 C)  SpO2: 97% 98%    Nursing Triage Note reviewed. Vital signs reviewed and patients oxygen saturation is normoxic  General: Patient is well nourished, well developed, awake and alert, resting comfortably in no acute distress Head: Normocephalic and  atraumatic Eyes: Normal inspection, extraocular muscles intact, no conjunctival pallor Ear, nose, throat: Normal external exam Neck: Normal range of motion Respiratory: Patient is in no respiratory distress, lungs CTAB Cardiovascular: Patient is not tachycardic, RRR without murmur appreciated GI: Abd SNT with no guarding or rebound  Back: Normal inspection of the back with good strength and range of motion throughout all ext Extremities: pulses intact with good cap refills, no LE pitting edema or calf tenderness Neuro: The patient is alert and oriented to person, place, and time, appropriately conversive, with 5/5 bilat UE/LE strength, no gross motor or sensory defects noted. Coordination appears to be adequate. Skin: Warm, dry, and intact Psych: normal mood and affect, no SI or HI  ED Results / Procedures / Treatments   Labs (all labs ordered are listed, but only abnormal results are displayed) Labs Reviewed  BASIC METABOLIC PANEL WITH GFR - Abnormal; Notable for the following components:      Result Value   Sodium 134 (*)    Calcium 8.0 (*)    All other components within  normal limits  CBC  TROPONIN T, HIGH SENSITIVITY  TROPONIN T, HIGH SENSITIVITY     EKG EKG and rhythm strip are interpreted by myself:   EKG: [Normal sinus rhythm] at heart rate of 68, RBB, QTc 495, nonspecific ST segments and T waves no ectopy EKG not consistent with Acute STEMI Rhythm strip: NSR in lead II No significant change from EKG completed on 07/24/2021  RADIOLOGY CXR: No acute abnormality on my independent review interpretation radiologist agrees    PROCEDURES:  Critical Care performed: No  Procedures   MEDICATIONS ORDERED IN ED: Medications  famotidine  (PEPCID ) IVPB 20 mg premix (0 mg Intravenous Stopped 05/19/24 1558)     IMPRESSION / MDM / ASSESSMENT AND PLAN / ED COURSE                                Differential diagnosis includes, but is not limited to, ACS, pneumonia,  electrolyte derangement, GERD, musculoskeletal  ED course: Patient's EKG demonstrates no evidence of new right heart strain or any acute ischemia.  She had no leukocytosis and no profound electrolyte derangements or acute renal insufficiency.  2 troponins that were high-sensitivity were not elevated.  Chest x-ray demonstrated no pneumonia, pneumothorax or any other abnormality.  Patient felt reassured and her symptoms improved with a dose of Pepcid .  She feels comfortable returning home and all questions answered patient voiced understanding and requested discharge   Clinical Course as of 05/19/24 2319  Fri May 19, 2024  1523 SpO2: 98 % Satting well [HD]  1530 Patient declines sucrafate, states she knows she has an ulcer.  [HD]  1542 Troponin T High Sensitivity: <15 Repeat negative will work on discharge [HD]  1543 Patient reassessed feels improved feels comfortable returning home and following up with her cardiologist.  Return precautions discussed and patient voiced understanding [HD]    Clinical Course User Index [HD] Nicholaus Rolland BRAVO, MD   At time of discharge there is no evidence of acute life, limb, vision, or fertility threat. Patient has stable vital signs, pain is well controlled, patient is ambulatory and p.o. tolerant.  Discharge instructions were completed using the EPIC system. I would refer you to those at this time. All warnings prescriptions follow-up etc. were discussed in detail with the patient. Patient indicates understanding and is agreeable with this plan. All questions answered.  Patient is made aware that they may return to the emergency department for any worsening or new condition or for any other emergency.   -- Risk: 5 This patient has a high risk of morbidity due to further diagnostic testing or treatment. Rationale: This patients evaluation and management involve a high risk of morbidity due to the potential severity of presenting symptoms, need for diagnostic  testing, and/or initiation of treatment that may require close monitoring. The differential includes conditions with potential for significant deterioration or requiring escalation of care. Treatment decisions in the ED, including medication administration, procedural interventions, or disposition planning, reflect this level of risk. COPA: 5 The patient has the following acute or chronic illness/injury that poses a possible threat to life or bodily function: [X] : The patient has a potentially serious acute condition or an acute exacerbation of a chronic illness requiring urgent evaluation and management in the Emergency Department. The clinical presentation necessitates immediate consideration of life-threatening or function-threatening diagnoses, even if they are ultimately ruled out.   FINAL CLINICAL IMPRESSION(S) / ED DIAGNOSES   Final  diagnoses:  Nonspecific chest pain     Rx / DC Orders   ED Discharge Orders     None        Note:  This document was prepared using Dragon voice recognition software and may include unintentional dictation errors.   Nicholaus Rolland BRAVO, MD 05/19/24 [phone]  "

## 2024-05-19 NOTE — ED Triage Notes (Signed)
 Pt BIB [facility] from home C/O midsternal chest pain, dizziness, nausea X 1 hour. Also reports numbness to lips, states that's what happened the last time I had a heart blockage. EMS gave 324 ASA and 1 spray nitroglycerin en route.

## 2024-05-19 NOTE — Discharge Instructions (Signed)
 A cause for his symptoms was not found today but I do not think you are having a heart attack.  I do think he was safe to return home.  I do worry about gastritis or ulcer pain.  Please stick to a bland diet, if you notice dark stools or blood in your stools please call your primary care physician or return to the emergency department.  Return with any acutely worsening symptoms.  Do your best to slowly cut down on the use of coffee and smoking.  It was very nice meeting you and I wish you the best of luck. -- RETURN PRECAUTIONS & AFTERCARE: (ENGLISH) RETURN PRECAUTIONS: Return immediately to the emergency department or see/call your doctor if you feel worse, weak or have changes in speech or vision, are short of breath, have fever, vomiting, pain, bleeding or dark stool, trouble urinating or any new issues. Return here or see/call your doctor if not improving as expected for your suspected condition. FOLLOW-UP CARE: Call your doctor and/or any doctors we referred you to for more advice and to make an appointment. Do this today, tomorrow or after the weekend. Some doctors only take PPO insurance so if you have HMO insurance you may want to contact your HMO or your regular doctor for referral to a specialist within your plan. Either way tell the doctor's office that it was a referral from the emergency department so you get the soonest possible appointment.  YOUR TEST RESULTS: Take result reports of any blood or urine tests, imaging tests and EKG's to your doctor and any referral doctor. Have any abnormal tests repeated. Your doctor or a referral doctor can let you know when this should be done. Also make sure your doctor contacts this hospital to get any test results that are not currently available such as cultures or special tests for infection and final imaging reports, which are often not available at the time you leave the ER but which may list additional important findings that are not documented on the  preliminary report. BLOOD PRESSURE: If your blood pressure was greater than 120/80 have your blood pressure rechecked within 1 to 2 weeks. MEDICATION SIDE EFFECTS: Do not drive, walk, bike, take the bus, etc. if you have received or are being prescribed any sedating medications such as those for pain or anxiety or certain antihistamines like Benadryl. If you have been give one of these here get a taxi home or have a friend drive you home. Ask your pharmacist to counsel you on potential side effects of any new medication
